# Patient Record
Sex: Female | Born: 1940 | Race: Black or African American | Hispanic: No | Smoking: Never smoker
Health system: Southern US, Community
[De-identification: ages and names within clinical notes are randomized; demographics above are authoritative.]

## PROBLEM LIST (undated history)

## (undated) DIAGNOSIS — M1990 Unspecified osteoarthritis, unspecified site: Secondary | ICD-10-CM

## (undated) DIAGNOSIS — M109 Gout, unspecified: Secondary | ICD-10-CM

## (undated) DIAGNOSIS — I10 Essential (primary) hypertension: Secondary | ICD-10-CM

## (undated) DIAGNOSIS — R51 Headache: Secondary | ICD-10-CM

## (undated) DIAGNOSIS — E119 Type 2 diabetes mellitus without complications: Secondary | ICD-10-CM

## (undated) DIAGNOSIS — J8410 Pulmonary fibrosis, unspecified: Secondary | ICD-10-CM

## (undated) HISTORY — PX: JOINT REPLACEMENT: SHX530

## (undated) HISTORY — PX: BACK SURGERY: SHX140

---

## 2002-12-18 ENCOUNTER — Encounter: Payer: Self-pay | Admitting: Neurosurgery

## 2002-12-21 ENCOUNTER — Encounter: Payer: Self-pay | Admitting: Neurosurgery

## 2002-12-21 ENCOUNTER — Inpatient Hospital Stay (HOSPITAL_COMMUNITY): Admission: RE | Admit: 2002-12-21 | Discharge: 2002-12-22 | Payer: Self-pay | Admitting: Neurosurgery

## 2003-02-13 ENCOUNTER — Encounter: Payer: Self-pay | Admitting: Neurosurgery

## 2003-02-13 ENCOUNTER — Encounter: Admission: RE | Admit: 2003-02-13 | Discharge: 2003-02-13 | Payer: Self-pay | Admitting: Neurosurgery

## 2003-03-21 ENCOUNTER — Encounter: Payer: Self-pay | Admitting: Neurosurgery

## 2003-03-21 ENCOUNTER — Encounter: Admission: RE | Admit: 2003-03-21 | Discharge: 2003-03-21 | Payer: Self-pay | Admitting: Neurosurgery

## 2007-09-23 ENCOUNTER — Inpatient Hospital Stay (HOSPITAL_COMMUNITY): Admission: RE | Admit: 2007-09-23 | Discharge: 2007-09-27 | Payer: Self-pay | Admitting: Orthopedic Surgery

## 2010-12-09 NOTE — Op Note (Signed)
[name], DACEY            ACCOUNT NO.:  [id number]   MEDICAL RECORD NO.:  [id number]          PATIENT TYPE:  INP   LOCATION:  5008                         FACILITY:  [facility]   PHYSICIAN:  Harvie Junior, M.D.   DATE OF BIRTH:  18-Oct-1940   DATE OF PROCEDURE:  09/23/2007  DATE OF DISCHARGE:                               OPERATIVE REPORT   PREOPERATIVE DIAGNOSIS:  End-stage degenerative joint disease, right  knee.   POSTOPERATIVE DIAGNOSIS:  End-stage degenerative joint disease, right  knee.   PRINCIPAL PROCEDURES:  1. Right total knee replacement with [employer] size 3 femur, size      2.5 tibia, 10-mm bridging bearing, 38-mm all-polyethylene patella.  2. Computer-assisted right total knee replacement.   SURGEON:  Harvie Junior, M.D.   ASSISTANT:  Marshia Ly, P.A.   ANESTHESIA:  General.   BRIEF HISTORY:  [name] is a 70 year old female with a long  history of having had significant right knee pain.  We treated her  conservatively for a long period of time.  Because of continued  complaints of pain, she was ultimately taken to the operating room after  failure of all conservative care for a right total knee replacement.  Preoperative x-ray showed severe end-stage degenerative joint disease of  the knee.  Because of her youthful appearance and age, it was felt that  computer-assisted alignment was critical to the approach to the case,  and this was chosen to be used preoperatively.   PROCEDURE:  The patient was taken to the operating room and after an  adequate level of anesthesia was obtained with a general anesthetic, the  patient was placed supine on the operating table, where the right leg  was prepped and draped in the usual sterile fashion.  Following this the  leg was exsanguinated and a blood pressure tourniquet inflated to 350  mmHg.  Following this a midline incision made, subcutaneous tissue taken  down to the level of the extensor mechanism,  and a medial parapatellar  arthrotomy was undertaken.  Once this was completed, the medial and  lateral meniscus were removed as well as the anterior and posterior  cruciates and the retropatellar fat pad.  Once this was done attention  was turned toward the computer assistance and two pins were placed in  the tibia, two pins in the femur, and the computer arrays were aligned,  and following this the registration process was undertaken.  This adds  about 20 minutes to the surgical procedure.  Once the long alignment was  obtained, the tibia was cut perpendicular to its long axis and the femur  was cut perpendicular to the anatomic axis and the gaps were then  measured with a spacer block.  Once this was completed, attention was  turned toward the sizing of the femur, where the femur was sized to a 3  and the anterior-posterior cuts were made as well as the chamfers and  the box cut was made.  At this point the spacer blocks had been used  with the cutting block to make sure that the flexion gap was going  to be  sufficient, and it felt to be perfectly sufficient.  Once this was  completed, the attention was turned toward the trial components.  A  trial 2.5 tibia was put in place, a trial 3 femur, a 10-mm bridging  bearing.  Attention was then turned to the patella.  A 38-mm paddle was  used and had excellent coverage, and it was medialized to flip to  lateralize the patella at insertion.  Once this was completed, the trial  poly patella was put in place and then the computer was used to assess  the long alignment.  A perfect neutral long alignment was achieved and  got balance within 5 mm at 90 degrees of flexion, perfect gap balance.  Following this, attention was turned toward removal of the trial  components.  The knee was then copiously and thoroughly lavaged with the  pulsatile lavage irrigator and suctioned dry and the final components  were cemented into place, size 3 femur, size 2.5  tibia, a 38-mm all-poly  patella and a 10-mm trial was put in place with a peg and the knee held  in about 10 degrees of flexion and then with pressure to help the cement  mantle.  Once that was completed, the attention was turned toward  allowing the cement to dry and all excess cement was removed.  The  tourniquet at this point was let down once the cement was completely  hardened and the bleeding was controlled with electrocautery.  At this  point the knee was then copiously irrigated and suctioned dry.  The  final poly was put in place.  The knee was put through a range of motion  and excellent range of motion achieved medial.  A Hemovac was placed.  The medial parapatellar arthrotomy was closed with a #1 Vicryl running.  The skin was then closed with 0 and 2-0 Vicryl and skin staples.  A  sterile compressive dressing was applied as well as a knee immobilizer.  The patient was taken to the recovery room, where she was noted to be in  satisfactory condition.  Estimated blood loss for the procedure was  none.      Harvie Junior, M.D.  Electronically Signed     JLG/MEDQ  D:  09/23/2007  T:  09/24/2007  Job:  [id number]

## 2010-12-12 NOTE — Op Note (Signed)
[name], [name]                        ACCOUNT NO.:  [id number]   MEDICAL RECORD NO.:  [id number]                   PATIENT TYPE:  INP   LOCATION:  2899                                 FACILITY:  [facility]   PHYSICIAN:  [name], M.D.              DATE OF BIRTH:  12-18-1940   DATE OF PROCEDURE:  12/21/2002  DATE OF DISCHARGE:                                 OPERATIVE REPORT   PREOPERATIVE DIAGNOSIS:  Spinal stenosis with spondylolisthesis, L4-5.   POSTOPERATIVE DIAGNOSIS:  Spinal stenosis with spondylolisthesis, L4-5.   OPERATION PERFORMED:  L4-5 decompressive laminectomy with posterior lumbar  interbody fusion with bone spacers and autologous bone graft followed by  Nuvasive transfacet screws.   SECONDARY PROCEDURE:  Microdissection, L4-5 disk and L5 nerve root  bilaterally and intraoperative EMG monitoring.   SURGEON:  [name], M.D.   ASSISTANT:  [name], M.D.   ANESTHESIA:  General.   DESCRIPTION OF PROCEDURE:  After being placed in prone position, the  patient's back was prepped and draped in the usual sterile fashion.  Localizing x-ray was taken prior to the incision to identify the appropriate  level.  A midline incision was made above the spinous processes of L4 and  L5.  Using Bovie cutting current, the incision was carried down to the  spinous processes.  Subperiosteal dissection was then carried out  bilaterally along the spinous processes, lamina and facet joints.  Self-  retaining retractor was placed for exposure.  X-ray showed approach to the  appropriate level.  Using Stille rongeur, the spinous process and  interspinous ligament were removed at L4-5.  Starting on the patient's left  side, laminotomy was performed by removing the inferior two thirds of the L4  lamina and the medial one third of the facet joint and the superior one  third of the L5 lamina.  Residual bone was removed and saved for use later  in the case and ligamentum  flavum was removed in a piecemeal fashion.  Similar decompression was carried out on the patient's right side once again  removing inferior two thirds of the L4 lamina, medial one third of the facet  joint, superior one third of the L5 lamina.  Once again residual bone was  removed and saved.  Ligamentum flavum removed.  The midline structure was  then removed to complete the bilaterally decompressive laminectomy.  At this  point microdissection was used bilaterally to identify the L4-5 disk.  It  was coagulated and sized and cleaned out vigorously with pituitary rongeurs  and curets.  At this point inspection was carried out in all directions for  any evidence of residual compression and none could be identified.  A  posterior lumbar interbody fusion was then performed.  10 mm distraction was  placed on the patient's right side.  Interbody fusion was then performed on  the left.  Scraping was done followed by chiseling  with a 10 x 9 mm chisel  under fluoroscopic guidance into good position.  The chisel was then removed  and a 10 x 9 x 20 mm Nuvasive bone bank plug was placed without difficulty.  Fluoroscopy showed the plug to be in good position.  Distracters were  removed from the right side and a similar procedure was carried out on this  side.  Once again, fluoroscopy was used to confirm good placement of all  instrumentation.  Prior to placing a second bony spacer, autologous bone  graft was placed in the midline to help with the interbody fusion.  A second  bone spacer was then placed without difficulty and fluoroscopy showed them  to be in good position.  Transfacet screws were then placed bilaterally.  Starting on the patient's right side under fluoroscopic guidance, drill over  guidewire was passed through the inferior facet of L4 into the pedicle of  L5.  Good position was confirmed by AP and lateral fluoroscopy, direct  palpation of the pedicle and intraoperative __________  monitoring.  All  these showed no evidence of a breach of the pedicle.  35 mm screw was then  placed into good position without difficulty.  A similar procedure was then  carried out on the patient's left side, once again passing the drill over  the guidewire into good position which was confirmed once again by AP and  lateral fluoroscopy.  Once again there was no evidence of breach by either  fluoroscopy, direct palpation of the pedicle, intraoperative monitoring  electrically.  Once again, 35 mm screw was placed in excellent position.  Final fluoroscopy showed the screws and plates to be in good position.  Large amounts of irrigation were carried out at this time and any bleeding  was controlled with bipolar coagulation and Gelfoam.  The wound was then  closed using interrupted Vicryl on the muscle and fascia, subcutaneous and  subcuticular tissues and staples on the skin.  Sterile dressing was then  applied.  The patient was extubated and taken to recovery room in stable  condition.                                                [name], M.D.    ROK/MEDQ  D:  12/21/2002  T:  12/21/2002  Job:  [id number]

## 2010-12-12 NOTE — Discharge Summary (Signed)
NAMEDANAIJA, [name]            ACCOUNT NO.:  [id number]   MEDICAL RECORD NO.:  [id number]          PATIENT TYPE:  INP   LOCATION:  5008                         FACILITY:  [facility]   PHYSICIAN:  Harvie Junior, M.D.   DATE OF BIRTH:  [birth year]   DATE OF ADMISSION:  09/23/2007  DATE OF DISCHARGE:  09/27/2007                               DISCHARGE SUMMARY   ADMITTING DIAGNOSES:  1. End-stage degenerative joint disease, right knee.  2. Hypertension.  3. Type 2 diabetes mellitus.  4. Status post lumbar disk surgery 2004.   DISCHARGE DIAGNOSES:  1. End-stage degenerative joint disease, right knee.  2. Hypertension.  3. Type 2 diabetes mellitus.  4. Status post lumbar disk surgery 2004.   PROCEDURES IN THE HOSPITAL:  Right total knee arthroplasty computer  assisted.   PRIMARY PHYSICIAN:  Harvie Junior, MD   BRIEF HISTORY:  [name] is a pleasant 70 year old female from  [place] with a long history of right knee pain.  X-rays of the right  knee of the weightbearing variety shows she has bone-on-bone  degenerative arthritis of the right knee.  She had persistence of night  pain and pain with ambulation and got no relief with exhaustive  conservative treatment including injection therapy, modification of  activities and use for arthritis medications.  Because of her  radiographic and clinical findings, she was felt to be a candidate for a  right total knee replacement and is admitted for this.   PERTINENT LABORATORY STUDIES:  Chest x-ray on admission showed no active  cardiopulmonary disease.  EKG showed normal sinus rhythm and normal EKG.  Hemoglobin on admission was 12.5, hematocrit 37.6.  Indices were within  normal limits.  At present, variant hemoglobin was 10.4, #2 was 9.0, #3  was 9.9.  Protime on admission was 15 and an INR of 1.2 and a PTT of 27.  On the day of discharge, her protime was 32.2 seconds with an INR of 3.0  on Coumadin therapy.  Her CMET on admission  with slightly elevated  glucose of 167 and slightly elevated BUN of 29.  On postop day #1, her  BMET was normal, had an elevated glucose of 162.  On postop day #2, her  BUN was also normal with glucose of 159.  Urinalysis on admission showed  moderate leukocyte esterase with 11-20 WBCs and few bacteria.   HOSPITAL COURSE:  The patient showed was brought to the operating room  on September 23, 2007.  Preoperatively, she was given 1 gram of Ancef and  80 mg of gentamicin.  She did have some few WBCs in her urine and was  felt that gentamicin given preoperatively was to sterilize her bladder  for this procedure.  She underwent right knee surgery as well as got Dr.  Luiz Blare' operative note.  She postoperatively was given Ancef 1 gram IV  q.8 h. x3 doses and was put on a PCA morphine pump and IV fluids.  On  postop day #1, she is without complaints.  She had moderate knee pain.  Foley was intact.  She is taking fluids without  difficulty, not got her  ABG yet.  She had a temperature of 100.6.  Vital signs were stable.  Hemoglobin was 10.4.  INR and Coumadin therapy for DVT prophylaxis was  1.4.  She was continued on IV fluids, PCA morphine, and p.o. Coumadin.  She has got out of bed to chair.  Foley catheter was continued for an  additional day.  On the postop day #2, her hemoglobin was 9.0.  She had  some complaints of knee pain.  She was afebrile.  Her vital signs were  stable.  Her PCA morphine pump was discontinued, and her IV was  converted to saline lock.  Hemovac drain was pulled and her right knee  dressing was changed.  She continued to use the CPM machine for any  range of motion and physical therapy was done on a daily basis for  walker ambulation.  On postop day #3, she was making good progress, but  was not ready for discharge to home and at that point the additional  therapy caused her risk of fall.  On postop day #4, September 27, 2007, she  was overall doing fine.  She was taking  fluids and voiding without  difficulty.  She was progressing with physical therapy.  Vital signs  stable.  She is afebrile.  Her INR was 3.0.  Her saline line was  discontinued.  She is discharged home in improved condition.  She had a  regular diet and was kept her on Percocet 5 mg p.r.n. pain and  prescription for Coumadin for DVT prophylaxis x1 month.  She started  home health physical therapy 3 times a week for a walker ambulation,  weightbearing as tolerated on the right.  Follow up with Dr. Luiz Blare in  10 days.  Her incision was banded at the time of discharge.  Her calf  was soft and nontender.  She will continue her usual home medications,  were documented in the medical reconciliation form in her chart.      Marshia Ly, P.A.      Harvie Junior, M.D.  Electronically Signed    JB/MEDQ  D:  11/30/2007  T:  12/01/2007  Job:  [id number]   cc:   Konrad Felix

## 2011-04-17 LAB — BASIC METABOLIC PANEL
BUN: 13
Chloride: 104
Creatinine, Ser: 0.88
Glucose, Bld: 162 — ABNORMAL HIGH

## 2011-04-17 LAB — CBC
HCT: 30.8 — ABNORMAL LOW
MCHC: 33.3
MCHC: 33.7
MCV: 90.6
Platelets: 298
RBC: 4.13
RDW: 13.4
WBC: 11.3 — ABNORMAL HIGH
WBC: 13.2 — ABNORMAL HIGH

## 2011-04-17 LAB — COMPREHENSIVE METABOLIC PANEL
ALT: 21
AST: 26
Alkaline Phosphatase: 82
CO2: 26
Calcium: 9.8
Chloride: 103
GFR calc Af Amer: >60
GFR calc non Af Amer: 53 — ABNORMAL LOW
Potassium: 3.6
Sodium: 137

## 2011-04-17 LAB — URINE MICROSCOPIC-ADD ON

## 2011-04-17 LAB — URINALYSIS, ROUTINE W REFLEX MICROSCOPIC
Glucose, UA: NEGATIVE
Hgb urine dipstick: NEGATIVE
Ketones, ur: NEGATIVE
Protein, ur: NEGATIVE
pH: 5.5

## 2011-04-17 LAB — DIFFERENTIAL
Basophils Absolute: 0.2 — ABNORMAL HIGH
Eosinophils Absolute: 0.1
Eosinophils Relative: 1
Monocytes Absolute: 0.5

## 2011-04-17 LAB — TYPE AND SCREEN: ABO/RH(D): B POS

## 2011-04-17 LAB — ABO/RH: ABO/RH(D): B POS

## 2011-04-17 LAB — PROTIME-INR
Prothrombin Time: 15
Prothrombin Time: 17.9 — ABNORMAL HIGH

## 2011-04-20 LAB — BASIC METABOLIC PANEL
BUN: 11
CO2: 24
Calcium: 8.6
Creatinine, Ser: 0.97
GFR calc non Af Amer: 57 — ABNORMAL LOW
Glucose, Bld: 159 — ABNORMAL HIGH
Sodium: 137

## 2011-04-20 LAB — PROTIME-INR
INR: 1.4
INR: 3 — ABNORMAL HIGH
Prothrombin Time: 17.4 — ABNORMAL HIGH
Prothrombin Time: 24 — ABNORMAL HIGH
Prothrombin Time: 32.2 — ABNORMAL HIGH

## 2011-04-20 LAB — CBC
MCHC: 33.5
MCHC: 34.1
MCV: 91.6
Platelets: 247
RBC: 3.23 — ABNORMAL LOW
RDW: 13.1
RDW: 13.4

## 2012-11-08 ENCOUNTER — Other Ambulatory Visit (HOSPITAL_COMMUNITY): Payer: Self-pay | Admitting: Orthopaedic Surgery

## 2012-11-09 ENCOUNTER — Encounter (HOSPITAL_COMMUNITY): Payer: Self-pay | Admitting: Pharmacy Technician

## 2012-11-15 ENCOUNTER — Other Ambulatory Visit (HOSPITAL_COMMUNITY): Payer: Self-pay

## 2012-11-16 ENCOUNTER — Encounter (HOSPITAL_COMMUNITY): Payer: Self-pay

## 2012-11-16 ENCOUNTER — Encounter (HOSPITAL_COMMUNITY)
Admission: RE | Admit: 2012-11-16 | Discharge: 2012-11-16 | Disposition: A | Discharge status: Home / Self Care | Payer: Medicare Other | Source: Ambulatory Visit | Attending: Orthopaedic Surgery | Admitting: Orthopaedic Surgery

## 2012-11-16 ENCOUNTER — Ambulatory Visit (HOSPITAL_COMMUNITY)
Admission: RE | Admit: 2012-11-16 | Discharge: 2012-11-16 | Disposition: A | Discharge status: Home / Self Care | Payer: Medicare Other | Source: Ambulatory Visit | Attending: Orthopaedic Surgery | Admitting: Orthopaedic Surgery

## 2012-11-16 DIAGNOSIS — I359 Nonrheumatic aortic valve disorder, unspecified: Secondary | ICD-10-CM | POA: Insufficient documentation

## 2012-11-16 DIAGNOSIS — Z01812 Encounter for preprocedural laboratory examination: Secondary | ICD-10-CM | POA: Insufficient documentation

## 2012-11-16 DIAGNOSIS — Z01818 Encounter for other preprocedural examination: Secondary | ICD-10-CM | POA: Insufficient documentation

## 2012-11-16 DIAGNOSIS — M169 Osteoarthritis of hip, unspecified: Secondary | ICD-10-CM | POA: Insufficient documentation

## 2012-11-16 DIAGNOSIS — M1610 Unilateral primary osteoarthritis, unspecified hip: Secondary | ICD-10-CM | POA: Insufficient documentation

## 2012-11-16 DIAGNOSIS — I10 Essential (primary) hypertension: Secondary | ICD-10-CM | POA: Insufficient documentation

## 2012-11-16 DIAGNOSIS — Z01810 Encounter for preprocedural cardiovascular examination: Secondary | ICD-10-CM | POA: Insufficient documentation

## 2012-11-16 HISTORY — DX: Headache: R51

## 2012-11-16 HISTORY — DX: Type 2 diabetes mellitus without complications: E11.9

## 2012-11-16 HISTORY — DX: Essential (primary) hypertension: I10

## 2012-11-16 HISTORY — DX: Unspecified osteoarthritis, unspecified site: M19.90

## 2012-11-16 HISTORY — DX: Pulmonary fibrosis, unspecified: J84.10

## 2012-11-16 LAB — BASIC METABOLIC PANEL
BUN: 18 mg/dL (ref 6–23)
Creatinine, Ser: 0.96 mg/dL (ref 0.50–1.10)
GFR calc non Af Amer: 57 mL/min — ABNORMAL LOW (ref >90)
Glucose, Bld: 185 mg/dL — ABNORMAL HIGH (ref 70–99)
Potassium: 3.6 mEq/L (ref 3.5–5.1)

## 2012-11-16 LAB — URINALYSIS, ROUTINE W REFLEX MICROSCOPIC
Glucose, UA: NEGATIVE mg/dL
Nitrite: NEGATIVE
Specific Gravity, Urine: 1.027 (ref 1.005–1.030)
pH: 5.5 (ref 5.0–8.0)

## 2012-11-16 LAB — URINE MICROSCOPIC-ADD ON

## 2012-11-16 LAB — CBC
Hemoglobin: 11.7 g/dL — ABNORMAL LOW (ref 12.0–15.0)
MCH: 29.9 pg (ref 26.0–34.0)
RBC: 3.91 MIL/uL (ref 3.87–5.11)
WBC: 9.1 10*3/uL (ref 4.0–10.5)

## 2012-11-16 LAB — APTT: aPTT: 29 seconds (ref 24–37)

## 2012-11-16 LAB — SURGICAL PCR SCREEN
MRSA, PCR: NEGATIVE
Staphylococcus aureus: NEGATIVE

## 2012-11-16 NOTE — Progress Notes (Signed)
Last office visit note with Dr Donnel Saxon ( PCP) placed on chart dated 10/24/12.

## 2012-11-16 NOTE — Patient Instructions (Signed)
KARNA ABED  11/16/2012   Your procedure is scheduled on:  11/18/12   Report to [facility] at     0730  AM.  Call this number if you have problems the morning of surgery: [phone]   Remember:   Do not eat food or drink liquids after midnight.   Take these medicines the morning of surgery with A SIP OF WATER:    Do not wear jewelry, make-up or nail polish.  Do not wear lotions, powders, or perfumes.   Do not shave 48 hours prior to surgery.   Do not bring valuables to the hospital.  Contacts, dentures or bridgework may not be worn into surgery.  Leave suitcase in the car. After surgery it may be brought to your room.  For patients admitted to the hospital, checkout time is 11:00 AM the day of  discharge.       SEE CHG INSTRUCTION SHEET    Please read over the following fact sheets that you were given: MRSA Information, coughing and deep breathing exercises, leg exercises,Blood Transfusion Fact sheet                Failure to comply with these instructions may result in cancellation of your surgery.                Patient Signature ____________________________              Nurse Signature _____________________________

## 2012-11-18 ENCOUNTER — Inpatient Hospital Stay (HOSPITAL_COMMUNITY)
Admission: RE | Admit: 2012-11-18 | Discharge: 2012-11-21 | DRG: 470 | Disposition: A | Discharge status: Skilled Nursing Facility | Payer: Medicare Other | Source: Ambulatory Visit | Attending: Orthopaedic Surgery | Admitting: Orthopaedic Surgery

## 2012-11-18 ENCOUNTER — Encounter (HOSPITAL_COMMUNITY): Payer: Self-pay | Admitting: *Deleted

## 2012-11-18 ENCOUNTER — Inpatient Hospital Stay (HOSPITAL_COMMUNITY): Payer: Medicare Other

## 2012-11-18 ENCOUNTER — Inpatient Hospital Stay (HOSPITAL_COMMUNITY): Payer: Medicare Other | Admitting: Anesthesiology

## 2012-11-18 ENCOUNTER — Encounter (HOSPITAL_COMMUNITY)
Admission: RE | Disposition: A | Discharge status: Skilled Nursing Facility | Payer: Self-pay | Source: Ambulatory Visit | Attending: Orthopaedic Surgery

## 2012-11-18 ENCOUNTER — Encounter (HOSPITAL_COMMUNITY): Payer: Self-pay | Admitting: Anesthesiology

## 2012-11-18 DIAGNOSIS — K5900 Constipation, unspecified: Secondary | ICD-10-CM | POA: Diagnosis present

## 2012-11-18 DIAGNOSIS — I10 Essential (primary) hypertension: Secondary | ICD-10-CM | POA: Diagnosis present

## 2012-11-18 DIAGNOSIS — E119 Type 2 diabetes mellitus without complications: Secondary | ICD-10-CM | POA: Diagnosis present

## 2012-11-18 DIAGNOSIS — M1610 Unilateral primary osteoarthritis, unspecified hip: Principal | ICD-10-CM | POA: Diagnosis present

## 2012-11-18 DIAGNOSIS — D62 Acute posthemorrhagic anemia: Secondary | ICD-10-CM | POA: Diagnosis not present

## 2012-11-18 DIAGNOSIS — M169 Osteoarthritis of hip, unspecified: Secondary | ICD-10-CM

## 2012-11-18 DIAGNOSIS — Z96659 Presence of unspecified artificial knee joint: Secondary | ICD-10-CM

## 2012-11-18 HISTORY — PX: TOTAL HIP ARTHROPLASTY: SHX124

## 2012-11-18 LAB — TYPE AND SCREEN: ABO/RH(D): B POS

## 2012-11-18 LAB — GLUCOSE, CAPILLARY: Glucose-Capillary: 147 mg/dL — ABNORMAL HIGH (ref 70–99)

## 2012-11-18 SURGERY — ARTHROPLASTY, HIP, TOTAL, ANTERIOR APPROACH
Anesthesia: Spinal | Site: Hip | Laterality: Left | Wound class: Clean

## 2012-11-18 MED ORDER — ATORVASTATIN CALCIUM 40 MG PO TABS
40.0000 mg | ORAL_TABLET | Freq: Every day | ORAL | Status: DC
  Administered 2012-11-18 – 2012-11-20 (×3): 40 mg via ORAL
  Filled 2012-11-18 (×4): qty 1

## 2012-11-18 MED ORDER — CEFAZOLIN SODIUM 1-5 GM-% IV SOLN
1.0000 g | Freq: Four times a day (QID) | INTRAVENOUS | Status: AC
  Administered 2012-11-18 (×2): 1 g via INTRAVENOUS
  Filled 2012-11-18 (×2): qty 50

## 2012-11-18 MED ORDER — LIDOCAINE HCL (CARDIAC) 10 MG/ML IV SOLN
INTRAVENOUS | Status: DC | PRN
  Administered 2012-11-18: 50 mg via INTRAVENOUS

## 2012-11-18 MED ORDER — PROPOFOL 10 MG/ML IV EMUL
INTRAVENOUS | Status: DC | PRN
  Administered 2012-11-18: 50 ug/kg/min via INTRAVENOUS

## 2012-11-18 MED ORDER — ALUM & MAG HYDROXIDE-SIMETH 200-200-20 MG/5ML PO SUSP: 30.0000 mL | ORAL | Status: DC | PRN

## 2012-11-18 MED ORDER — ACETAMINOPHEN 325 MG PO TABS: 650.0000 mg | ORAL_TABLET | Freq: Four times a day (QID) | ORAL | Status: DC | PRN

## 2012-11-18 MED ORDER — FENTANYL CITRATE 0.05 MG/ML IJ SOLN
INTRAMUSCULAR | Status: DC | PRN
  Administered 2012-11-18: 100 ug via INTRAVENOUS

## 2012-11-18 MED ORDER — SODIUM CHLORIDE 0.9 % IV SOLN
10.0000 mg | INTRAVENOUS | Status: DC | PRN
  Administered 2012-11-18: 50 ug/min via INTRAVENOUS

## 2012-11-18 MED ORDER — ASPIRIN EC 325 MG PO TBEC
325.0000 mg | DELAYED_RELEASE_TABLET | Freq: Two times a day (BID) | ORAL | Status: DC
  Administered 2012-11-19 – 2012-11-21 (×5): 325 mg via ORAL
  Filled 2012-11-18 (×7): qty 1

## 2012-11-18 MED ORDER — SODIUM CHLORIDE 0.9 % IR SOLN
Status: DC | PRN
  Administered 2012-11-18: 1000 mL

## 2012-11-18 MED ORDER — KETOROLAC TROMETHAMINE 30 MG/ML IJ SOLN
15.0000 mg | Freq: Once | INTRAMUSCULAR | Status: AC | PRN
  Administered 2012-11-18: 30 mg via INTRAVENOUS

## 2012-11-18 MED ORDER — CEFAZOLIN SODIUM-DEXTROSE 2-3 GM-% IV SOLR
2.0000 g | INTRAVENOUS | Status: AC
  Administered 2012-11-18: 2 g via INTRAVENOUS

## 2012-11-18 MED ORDER — ONDANSETRON HCL 4 MG/2ML IJ SOLN
4.0000 mg | Freq: Four times a day (QID) | INTRAMUSCULAR | Status: DC | PRN
  Administered 2012-11-19: 4 mg via INTRAVENOUS
  Filled 2012-11-18: qty 2

## 2012-11-18 MED ORDER — ACETAMINOPHEN 650 MG RE SUPP: 650.0000 mg | Freq: Four times a day (QID) | RECTAL | Status: DC | PRN

## 2012-11-18 MED ORDER — OLMESARTAN MEDOXOMIL 40 MG PO TABS
40.0000 mg | ORAL_TABLET | Freq: Every day | ORAL | Status: DC
  Administered 2012-11-18 – 2012-11-21 (×4): 40 mg via ORAL
  Filled 2012-11-18 (×4): qty 1

## 2012-11-18 MED ORDER — NEBIVOLOL HCL 10 MG PO TABS
10.0000 mg | ORAL_TABLET | Freq: Every morning | ORAL | Status: DC
  Administered 2012-11-19 – 2012-11-21 (×3): 10 mg via ORAL
  Filled 2012-11-18 (×3): qty 1

## 2012-11-18 MED ORDER — HYDROMORPHONE HCL PF 1 MG/ML IJ SOLN: 1.0000 mg | INTRAMUSCULAR | Status: DC | PRN

## 2012-11-18 MED ORDER — ZOLPIDEM TARTRATE 5 MG PO TABS: 5.0000 mg | ORAL_TABLET | Freq: Every evening | ORAL | Status: DC | PRN

## 2012-11-18 MED ORDER — PROMETHAZINE HCL 25 MG/ML IJ SOLN: 6.2500 mg | INTRAMUSCULAR | Status: DC | PRN

## 2012-11-18 MED ORDER — VITAMIN D (ERGOCALCIFEROL) 1.25 MG (50000 UNIT) PO CAPS
50000.0000 [IU] | ORAL_CAPSULE | ORAL | Status: DC
  Administered 2012-11-21: 50000 [IU] via ORAL
  Filled 2012-11-18: qty 1

## 2012-11-18 MED ORDER — LACTATED RINGERS IV SOLN
INTRAVENOUS | Status: DC
  Administered 2012-11-18: 11:00:00 via INTRAVENOUS
  Administered 2012-11-18: 1000 mL via INTRAVENOUS

## 2012-11-18 MED ORDER — OXYCODONE HCL 5 MG PO TABS
5.0000 mg | ORAL_TABLET | ORAL | Status: DC | PRN
  Administered 2012-11-18: 10 mg via ORAL
  Administered 2012-11-19 (×3): 5 mg via ORAL
  Administered 2012-11-19: 10 mg via ORAL
  Administered 2012-11-19 – 2012-11-20 (×2): 5 mg via ORAL
  Administered 2012-11-20: 10 mg via ORAL
  Administered 2012-11-21: 5 mg via ORAL
  Filled 2012-11-18 (×4): qty 1
  Filled 2012-11-18 (×3): qty 2
  Filled 2012-11-18: qty 1
  Filled 2012-11-18: qty 2

## 2012-11-18 MED ORDER — MIDAZOLAM HCL 5 MG/5ML IJ SOLN
INTRAMUSCULAR | Status: DC | PRN
  Administered 2012-11-18: 2 mg via INTRAVENOUS

## 2012-11-18 MED ORDER — GLIMEPIRIDE 4 MG PO TABS
4.0000 mg | ORAL_TABLET | Freq: Every day | ORAL | Status: DC
  Administered 2012-11-18 – 2012-11-21 (×4): 4 mg via ORAL
  Filled 2012-11-18 (×5): qty 1

## 2012-11-18 MED ORDER — DIPHENHYDRAMINE HCL 12.5 MG/5ML PO ELIX: 12.5000 mg | ORAL_SOLUTION | ORAL | Status: DC | PRN

## 2012-11-18 MED ORDER — METHOCARBAMOL 100 MG/ML IJ SOLN: 500.0000 mg | Freq: Four times a day (QID) | INTRAVENOUS | Status: DC | PRN

## 2012-11-18 MED ORDER — ALISKIREN FUMARATE 150 MG PO TABS
150.0000 mg | ORAL_TABLET | Freq: Every day | ORAL | Status: DC
  Administered 2012-11-19 – 2012-11-21 (×3): 150 mg via ORAL
  Filled 2012-11-18 (×3): qty 1

## 2012-11-18 MED ORDER — OXYCODONE HCL ER 10 MG PO T12A
10.0000 mg | EXTENDED_RELEASE_TABLET | Freq: Two times a day (BID) | ORAL | Status: DC
  Administered 2012-11-18 – 2012-11-21 (×6): 10 mg via ORAL
  Filled 2012-11-18 (×5): qty 1

## 2012-11-18 MED ORDER — HYDRALAZINE HCL 50 MG PO TABS
50.0000 mg | ORAL_TABLET | Freq: Every day | ORAL | Status: DC
  Administered 2012-11-19 – 2012-11-21 (×3): 50 mg via ORAL
  Filled 2012-11-18 (×3): qty 1

## 2012-11-18 MED ORDER — PHENOL 1.4 % MT LIQD: 1.0000 | OROMUCOSAL | Status: DC | PRN

## 2012-11-18 MED ORDER — MENTHOL 3 MG MT LOZG: 1.0000 | LOZENGE | OROMUCOSAL | Status: DC | PRN

## 2012-11-18 MED ORDER — STERILE WATER FOR IRRIGATION IR SOLN
Status: DC | PRN
  Administered 2012-11-18: 3000 mL

## 2012-11-18 MED ORDER — HYDROCHLOROTHIAZIDE 25 MG PO TABS
25.0000 mg | ORAL_TABLET | Freq: Every day | ORAL | Status: DC
  Administered 2012-11-18 – 2012-11-21 (×4): 25 mg via ORAL
  Filled 2012-11-18 (×5): qty 1

## 2012-11-18 MED ORDER — BUPIVACAINE HCL (PF) 0.5 % IJ SOLN
INTRAMUSCULAR | Status: DC | PRN
  Administered 2012-11-18: 15 mg

## 2012-11-18 MED ORDER — METHOCARBAMOL 500 MG PO TABS
500.0000 mg | ORAL_TABLET | Freq: Four times a day (QID) | ORAL | Status: DC | PRN
  Administered 2012-11-18 – 2012-11-20 (×4): 500 mg via ORAL
  Filled 2012-11-18 (×4): qty 1

## 2012-11-18 MED ORDER — 0.9 % SODIUM CHLORIDE (POUR BTL) OPTIME
TOPICAL | Status: DC | PRN
  Administered 2012-11-18: 1000 mL

## 2012-11-18 MED ORDER — FENTANYL CITRATE 0.05 MG/ML IJ SOLN
25.0000 ug | INTRAMUSCULAR | Status: DC | PRN
  Administered 2012-11-18: 50 ug via INTRAVENOUS

## 2012-11-18 MED ORDER — DOCUSATE SODIUM 100 MG PO CAPS
100.0000 mg | ORAL_CAPSULE | Freq: Two times a day (BID) | ORAL | Status: DC
  Administered 2012-11-18 – 2012-11-21 (×6): 100 mg via ORAL

## 2012-11-18 MED ORDER — LINAGLIPTIN 5 MG PO TABS
5.0000 mg | ORAL_TABLET | Freq: Every day | ORAL | Status: DC
  Administered 2012-11-18 – 2012-11-21 (×4): 5 mg via ORAL
  Filled 2012-11-18 (×4): qty 1

## 2012-11-18 MED ORDER — OLMESARTAN MEDOXOMIL-HCTZ 40-25 MG PO TABS
1.0000 | ORAL_TABLET | Freq: Every morning | ORAL | Status: DC
Start: 2012-11-18 — End: 2012-11-18

## 2012-11-18 MED ORDER — SODIUM CHLORIDE 0.9 % IV SOLN
INTRAVENOUS | Status: DC
  Administered 2012-11-18: 75 mL/h via INTRAVENOUS
  Administered 2012-11-19 – 2012-11-20 (×2): via INTRAVENOUS

## 2012-11-18 MED ORDER — METOCLOPRAMIDE HCL 5 MG/ML IJ SOLN: 5.0000 mg | Freq: Three times a day (TID) | INTRAMUSCULAR | Status: DC | PRN

## 2012-11-18 MED ORDER — METOCLOPRAMIDE HCL 5 MG PO TABS
5.0000 mg | ORAL_TABLET | Freq: Three times a day (TID) | ORAL | Status: DC | PRN
  Filled 2012-11-18: qty 2

## 2012-11-18 MED ORDER — PROPOFOL 10 MG/ML IV BOLUS
INTRAVENOUS | Status: DC | PRN
  Administered 2012-11-18: 20 mg via INTRAVENOUS

## 2012-11-18 MED ORDER — ONDANSETRON HCL 4 MG PO TABS: 4.0000 mg | ORAL_TABLET | Freq: Four times a day (QID) | ORAL | Status: DC | PRN

## 2012-11-18 SURGICAL SUPPLY — 35 items
BAG ZIPLOCK 12X15 (MISCELLANEOUS) ×4 IMPLANT
BLADE SAW SGTL 18X1.27X75 (BLADE) ×2 IMPLANT
CELLS DAT CNTRL 66122 CELL SVR (MISCELLANEOUS) ×1 IMPLANT
CLOTH BEACON ORANGE TIMEOUT ST (SAFETY) ×2 IMPLANT
DERMABOND ADVANCED (GAUZE/BANDAGES/DRESSINGS) ×1
DERMABOND ADVANCED .7 DNX12 (GAUZE/BANDAGES/DRESSINGS) ×1 IMPLANT
DRAPE C-ARM 42X72 X-RAY (DRAPES) ×2 IMPLANT
DRAPE STERI IOBAN 125X83 (DRAPES) ×2 IMPLANT
DRAPE U-SHAPE 47X51 STRL (DRAPES) ×6 IMPLANT
DRSG AQUACEL AG ADV 3.5X10 (GAUZE/BANDAGES/DRESSINGS) ×2 IMPLANT
DURAPREP 26ML APPLICATOR (WOUND CARE) ×2 IMPLANT
ELECT BLADE TIP CTD 4 INCH (ELECTRODE) ×2 IMPLANT
ELECT REM PT RETURN 9FT ADLT (ELECTROSURGICAL) ×2
ELECTRODE REM PT RTRN 9FT ADLT (ELECTROSURGICAL) ×1 IMPLANT
FACESHIELD LNG OPTICON STERILE (SAFETY) ×10 IMPLANT
GLOVE BIO SURGEON STRL SZ7.5 (GLOVE) ×2 IMPLANT
GLOVE BIOGEL PI IND STRL 8 (GLOVE) ×3 IMPLANT
GLOVE BIOGEL PI INDICATOR 8 (GLOVE) ×3
GLOVE ECLIPSE 8.0 STRL XLNG CF (GLOVE) ×4 IMPLANT
GOWN STRL REIN XL XLG (GOWN DISPOSABLE) ×4 IMPLANT
HANDPIECE INTERPULSE COAX TIP (DISPOSABLE) ×1
KIT BASIN OR (CUSTOM PROCEDURE TRAY) ×2 IMPLANT
PACK TOTAL JOINT (CUSTOM PROCEDURE TRAY) ×2 IMPLANT
PADDING CAST COTTON 6X4 STRL (CAST SUPPLIES) ×2 IMPLANT
RTRCTR WOUND ALEXIS 18CM MED (MISCELLANEOUS) ×2
SET HNDPC FAN SPRY TIP SCT (DISPOSABLE) ×1 IMPLANT
SUT ETHIBOND NAB CT1 #1 30IN (SUTURE) ×4 IMPLANT
SUT MNCRL AB 4-0 PS2 18 (SUTURE) ×2 IMPLANT
SUT NYLON 3 0 (SUTURE) ×2 IMPLANT
SUT VIC AB 1 CT1 36 (SUTURE) ×4 IMPLANT
SUT VIC AB 2-0 CT1 27 (SUTURE) ×2
SUT VIC AB 2-0 CT1 TAPERPNT 27 (SUTURE) ×2 IMPLANT
TOWEL OR 17X26 10 PK STRL BLUE (TOWEL DISPOSABLE) ×4 IMPLANT
TOWEL OR NON WOVEN STRL DISP B (DISPOSABLE) ×2 IMPLANT
TRAY FOLEY CATH 14FRSI W/METER (CATHETERS) ×2 IMPLANT

## 2012-11-18 NOTE — H&P (Signed)
TOTAL HIP ADMISSION H&P  Patient is admitted for left total hip arthroplasty.  Subjective:  Chief Complaint: left hip pain  HPI: [name], 72 y.o. female, has a history of pain and functional disability in the left hip(s) due to arthritis and patient has failed non-surgical conservative treatments for greater than 12 weeks to include NSAID's and/or analgesics, corticosteriod injections, use of assistive devices, weight reduction as appropriate and activity modification.  Onset of symptoms was gradual starting 4 years ago with gradually worsening course since that time.The patient noted no past surgery on the left hip(s).  Patient currently rates pain in the left hip at 9 out of 10 with activity. Patient has night pain, worsening of pain with activity and weight bearing, trendelenberg gait, pain that interfers with activities of daily living and pain with passive range of motion. Patient has evidence of subchondral cysts, subchondral sclerosis, periarticular osteophytes and joint space narrowing by imaging studies. This condition presents safety issues increasing the risk of falls.  There is no current active infection.  Patient Active Problem List   Diagnosis Date Noted  . Degenerative arthritis of hip 11/18/2012   Past Medical History  Diagnosis Date  . Hypertension   . Lung fibrosis   . Diabetes mellitus without complication   . Headache     occasional headache   . Arthritis     Past Surgical History  Procedure Laterality Date  . Back surgery      lumbar fusion   . Joint replacement      right knee replacement     Prescriptions prior to admission  Medication Sig Dispense Refill  . aliskiren (TEKTURNA) 150 MG tablet Take 150 mg by mouth daily.      Marland Kitchen aspirin 325 MG tablet Take 325 mg by mouth daily.      . Aspirin-Acetaminophen-Caffeine (GOODYS EXTRA STRENGTH) [phone] MG PACK Take 1 packet by mouth every morning.      Marland Kitchen atorvastatin (LIPITOR) 40 MG tablet Take 40 mg  by mouth every morning.      . bisacodyl (DULCOLAX) 5 MG EC tablet Take 5 mg by mouth daily as needed for constipation.      Marland Kitchen glimepiride (AMARYL) 4 MG tablet Take 4 mg by mouth daily before breakfast.      . guaiFENesin-codeine (ROBITUSSIN AC) 100-10 MG/5ML syrup Take 5 mLs by mouth 3 (three) times daily as needed for cough.      . hydrALAZINE (APRESOLINE) 25 MG tablet Take 50 mg by mouth daily.       . nebivolol (BYSTOLIC) 10 MG tablet Take 10 mg by mouth every morning.      . olmesartan-hydrochlorothiazide (BENICAR HCT) 40-25 MG per tablet Take 1 tablet by mouth every morning.      Marland Kitchen oxyCODONE-acetaminophen (PERCOCET/ROXICET) 5-325 MG per tablet Take 1 tablet by mouth every 6 (six) hours as needed for pain.      . saxagliptin HCl (ONGLYZA) 5 MG TABS tablet Take 5 mg by mouth every morning.      . Vitamin D, Ergocalciferol, (DRISDOL) 50000 UNITS CAPS Take 50,000 Units by mouth 2 (two) times a week. On Monday and Thursdays       Allergies  Allergen Reactions  . Penicillins Hives and Rash    History  Substance Use Topics  . Smoking status: Never Smoker   . Smokeless tobacco: Never Used  . Alcohol Use: No    History reviewed. No pertinent family history.   Review of Systems  Musculoskeletal: Positive for joint pain.  All other systems reviewed and are negative.    Objective:  Physical Exam  Constitutional: She is oriented to person, place, and time. She appears well-developed and well-nourished.  HENT:  Head: Normocephalic and atraumatic.  Eyes: EOM are normal. Pupils are equal, round, and reactive to light.  Neck: Normal range of motion. Neck supple.  Cardiovascular: Normal rate and regular rhythm.   Respiratory: Effort normal and breath sounds normal.  GI: Soft. Bowel sounds are normal.  Musculoskeletal:       Left hip: She exhibits decreased range of motion, decreased strength, bony tenderness and crepitus.  Neurological: She is alert and oriented to person, place, and  time.  Skin: Skin is warm and dry.  Psychiatric: She has a normal mood and affect.    Vital signs in last 24 hours: Temp:  [98.6 F (37 C)] 98.6 F (37 C) (04/25 0708) Pulse Rate:  [81] 81 (04/25 0708) Resp:  [18] 18 (04/25 0708) BP: (179)/(71) 179/71 mmHg (04/25 0708) SpO2:  [99 %] 99 % (04/25 0708)  Labs:   There is no weight on file to calculate BMI.   Imaging Review Plain radiographs demonstrate severe degenerative joint disease of the left hip(s). The bone quality appears to be good for age and reported activity level.  Assessment/Plan:  End stage arthritis, left hip(s)  The patient history, physical examination, clinical judgement of the provider and imaging studies are consistent with end stage degenerative joint disease of the left hip(s) and total hip arthroplasty is deemed medically necessary. The treatment options including medical management, injection therapy, arthroscopy and arthroplasty were discussed at length. The risks and benefits of total hip arthroplasty were presented and reviewed. The risks due to aseptic loosening, infection, stiffness, dislocation/subluxation,  thromboembolic complications and other imponderables were discussed.  The patient acknowledged the explanation, agreed to proceed with the plan and consent was signed. Patient is being admitted for inpatient treatment for surgery, pain control, PT, OT, prophylactic antibiotics, VTE prophylaxis, progressive ambulation and ADL's and discharge planning.The patient is planning to be discharged home with home health services

## 2012-11-18 NOTE — Brief Op Note (Signed)
11/18/2012  12:14 PM  PATIENT:  [name]  72 y.o. female  PRE-OPERATIVE DIAGNOSIS:  Left hip osteoarthritis  POST-OPERATIVE DIAGNOSIS:  Left hip osteoarthritis  PROCEDURE:  Procedure(s): LEFT TOTAL HIP ARTHROPLASTY ANTERIOR APPROACH (Left)  SURGEON:  Surgeon(s) and Role:    * Kathryne Hitch, MD - Primary  PHYSICIAN ASSISTANT:   Rexene Edison, PA-C  ANESTHESIA:   spinal  EBL:  Total I/O In: 1000 [I.V.:1000] Out: 325 [Blood:325]  BLOOD ADMINISTERED:none  DRAINS: none   LOCAL MEDICATIONS USED:  NONE  SPECIMEN:  No Specimen  DISPOSITION OF SPECIMEN:  N/A  COUNTS:  YES  TOURNIQUET:  * No tourniquets in log *  DICTATION: .Other Dictation: Dictation Number [id number]  PLAN OF CARE: Admit to inpatient   PATIENT DISPOSITION:  PACU - hemodynamically stable.   Delay start of Pharmacological VTE agent (>24hrs) due to surgical blood loss or risk of bleeding: no

## 2012-11-18 NOTE — Anesthesia Procedure Notes (Signed)
Spinal Patient location during procedure: OR Staffing Performed by: anesthesiologist  Preanesthetic Checklist Completed: patient identified, site marked, surgical consent, pre-op evaluation, timeout performed, IV checked, risks and benefits discussed and monitors and equipment checked Spinal Block Patient position: sitting Prep: Betadine Patient monitoring: heart rate, continuous pulse ox and blood pressure Injection technique: single-shot Needle Needle type: Sprotte  Needle gauge: 22 G Needle length: 9 cm Additional Notes Expiration date of kit checked and confirmed. Patient tolerated procedure well, without complications.     

## 2012-11-18 NOTE — Anesthesia Preprocedure Evaluation (Signed)
Anesthesia Evaluation  Patient identified by MRN, date of birth, ID band Patient awake    Reviewed: Allergy & Precautions, H&P , NPO status , Patient's Chart, lab work & pertinent test results  Airway Mallampati: II TM Distance: <3 FB Neck ROM: Full    Dental no notable dental hx.    Pulmonary neg pulmonary ROS,  breath sounds clear to auscultation  Pulmonary exam normal       Cardiovascular hypertension, Pt. on medications Rhythm:Regular Rate:Normal     Neuro/Psych negative neurological ROS  negative psych ROS   GI/Hepatic negative GI ROS, Neg liver ROS,   Endo/Other  diabetes, Type 2  Renal/GU negative Renal ROS  negative genitourinary   Musculoskeletal negative musculoskeletal ROS (+)   Abdominal   Peds negative pediatric ROS (+)  Hematology negative hematology ROS (+)   Anesthesia Other Findings   Reproductive/Obstetrics negative OB ROS                           Anesthesia Physical Anesthesia Plan  ASA: II  Anesthesia Plan: Spinal   Post-op Pain Management:    Induction:   Airway Management Planned: Simple Face Mask  Additional Equipment:   Intra-op Plan:   Post-operative Plan:   Informed Consent: I have reviewed the patients History and Physical, chart, labs and discussed the procedure including the risks, benefits and alternatives for the proposed anesthesia with the patient or authorized representative who has indicated his/her understanding and acceptance.     Plan Discussed with: CRNA and Surgeon  Anesthesia Plan Comments:         Anesthesia Quick Evaluation

## 2012-11-18 NOTE — Transfer of Care (Signed)
Immediate Anesthesia Transfer of Care Note  Patient: [name]  Procedure(s) Performed: Procedure(s): LEFT TOTAL HIP ARTHROPLASTY ANTERIOR APPROACH (Left)  Patient Location: PACU  Anesthesia Type:Spinal  Level of Consciousness: awake, alert , oriented and patient cooperative  Airway & Oxygen Therapy: Patient Spontanous Breathing and Patient connected to face mask oxygen  Post-op Assessment: Report given to PACU RN, Post -op Vital signs reviewed and stable and Patient moving all extremities X 4  Post vital signs: stable  Complications: No apparent anesthesia complications  L4 spinal level moving feet unable to bend knees

## 2012-11-18 NOTE — Progress Notes (Signed)
Utilization review completed.  

## 2012-11-18 NOTE — Progress Notes (Signed)
Patient ID: [name], female   DOB: 01-29-1941, 72 y.o.   MRN: [id number] I have reviewed [name]'s x-rays from her direct anterior hip surgery.  She does not have a greater trochanteric fracture, but just a small cortical irregularity near the tip consistent with the surgery.  She can still put full weight on her left hip as tolerated.

## 2012-11-18 NOTE — Progress Notes (Signed)
PT NOTE:  PT attempted POD 0  but deferred at pt request.  Will follow in am.

## 2012-11-18 NOTE — Anesthesia Postprocedure Evaluation (Signed)
  Anesthesia Post-op Note  Patient: [name]  Procedure(s) Performed: Procedure(s) (LRB): LEFT TOTAL HIP ARTHROPLASTY ANTERIOR APPROACH (Left)  Patient Location: PACU  Anesthesia Type: Spinal  Level of Consciousness: awake and alert   Airway and Oxygen Therapy: Patient Spontanous Breathing  Post-op Pain: mild  Post-op Assessment: Post-op Vital signs reviewed, Patient's Cardiovascular Status Stable, Respiratory Function Stable, Patent Airway and No signs of Nausea or vomiting  Last Vitals:  Filed Vitals:   11/18/12 1315  BP: 178/71  Pulse: 74  Temp: 36.7 C  Resp: 12    Post-op Vital Signs: stable   Complications: No apparent anesthesia complications

## 2012-11-19 LAB — BASIC METABOLIC PANEL
BUN: 16 mg/dL (ref 6–23)
Calcium: 8.8 mg/dL (ref 8.4–10.5)
Creatinine, Ser: 0.99 mg/dL (ref 0.50–1.10)
GFR calc Af Amer: 64 mL/min — ABNORMAL LOW (ref >90)
GFR calc non Af Amer: 55 mL/min — ABNORMAL LOW (ref >90)

## 2012-11-19 LAB — CBC
HCT: 28.1 % — ABNORMAL LOW (ref 36.0–46.0)
MCH: 30.2 pg (ref 26.0–34.0)
MCHC: 33.5 g/dL (ref 30.0–36.0)
MCV: 90.4 fL (ref 78.0–100.0)
RDW: 13.9 % (ref 11.5–15.5)

## 2012-11-19 LAB — GLUCOSE, CAPILLARY: Glucose-Capillary: 196 mg/dL — ABNORMAL HIGH (ref 70–99)

## 2012-11-19 MED ORDER — MAGNESIUM HYDROXIDE 400 MG/5ML PO SUSP
15.0000 mL | Freq: Every day | ORAL | Status: DC | PRN
  Administered 2012-11-20: 15 mL via ORAL
  Filled 2012-11-19: qty 30

## 2012-11-19 MED ORDER — FLEET ENEMA 7-19 GM/118ML RE ENEM
1.0000 | ENEMA | Freq: Every day | RECTAL | Status: DC | PRN
  Administered 2012-11-20: 1 via RECTAL
  Filled 2012-11-19: qty 1

## 2012-11-19 NOTE — Evaluation (Signed)
Occupational Therapy Evaluation Patient Details Name: [name] MRN: [id number] DOB: 1941-05-30 Today's Date: 11/19/2012 Time: 0102-7253 OT Time Calculation (min): 22 min  OT Assessment / Plan / Recommendation Clinical Impression  Pt is s/p L direct anterior THA and displays decreased strength, some pain with activity and will benefit from skilled OT services to improve ADL independence.     OT Assessment  Patient needs continued OT Services    Follow Up Recommendations  Home health OT versus SNF;Depends on how much family assist is available and pt progress. Supervision/Assistance - 24 hour    Barriers to Discharge      Equipment Recommendations  None recommended by OT    Recommendations for Other Services    Frequency  Min 2X/week    Precautions / Restrictions Precautions Precautions: None Precaution Comments: direct anterior THA Restrictions Weight Bearing Restrictions: No Other Position/Activity Restrictions: WBAT        ADL  Eating/Feeding: Simulated;Independent Where Assessed - Eating/Feeding: Bed level Grooming: Simulated;Wash/dry hands;Set up Where Assessed - Grooming: Supported sitting Upper Body Bathing: Simulated;Chest;Right arm;Left arm;Abdomen;Set up Where Assessed - Upper Body Bathing: Unsupported sitting Lower Body Bathing: Simulated;+2 Total assistance Lower Body Bathing: Patient Percentage: 60% Where Assessed - Lower Body Bathing: Supported sit to stand Upper Body Dressing: Simulated;Set up Where Assessed - Upper Body Dressing: Unsupported sitting Lower Body Dressing: Simulated;+2 Total assistance Lower Body Dressing: Patient Percentage: 50% Where Assessed - Lower Body Dressing: Supported sit to stand Toilet Transfer: Simulated;+2 Total assistance Toilet Transfer: Patient Percentage: 70% Toilet Transfer Method: Sit to stand;Other (comment) (up to walk in hallway with PT, chair pulled up) Toileting - Clothing Manipulation and Hygiene:  Simulated;+2 Total assistance Toileting - Clothing Manipulation and Hygiene: Patient Percentage: 60% Where Assessed - Toileting Clothing Manipulation and Hygiene: Standing Equipment Used: Rolling walker ADL Comments: Pt very motivated. She states she lives alone and not sure that family can provide initial 24/7 assist. Will need to confirm how much help she will have. HH versus SNF. She is interested in AE for LB ADL    OT Diagnosis: Generalized weakness  OT Problem List: Decreased strength OT Treatment Interventions: Self-care/ADL training;DME and/or AE instruction;Patient/family education;Therapeutic activities   OT Goals Acute Rehab OT Goals OT Goal Formulation: With patient Time For Goal Achievement: 11/26/12 Potential to Achieve Goals: Good ADL Goals Pt Will Perform Grooming: with min assist;Standing at sink (min guard) ADL Goal: Grooming - Progress: Goal set today Pt Will Perform Lower Body Bathing: with min assist;with adaptive equipment;Sit to stand from chair;Sit to stand from bed ADL Goal: Lower Body Bathing - Progress: Goal set today Pt Will Perform Lower Body Dressing: with min assist;Sit to stand from chair;Sit to stand from bed;with adaptive equipment ADL Goal: Lower Body Dressing - Progress: Goal set today Pt Will Transfer to Toilet: with min assist;Ambulation;3-in-1 (min guard) ADL Goal: Toilet Transfer - Progress: Goal set today Pt Will Perform Toileting - Clothing Manipulation: with min assist (min guard) ADL Goal: Toileting - Clothing Manipulation - Progress: Goal set today  Visit Information  Last OT Received On: 11/19/12 Assistance Needed: +2 PT/OT Co-Evaluation/Treatment: Yes    Subjective Data  Subjective: I know I need to get up Patient Stated Goal: be as independent as possible   Prior Functioning     Home Living Lives With: Alone Available Help at Discharge: Family (not sure that they can be there 24/7) Type of Home: House Home Layout: One  level Bathroom Shower/Tub: Engineer, manufacturing systems: Handicapped height Home  Adaptive Equipment: Bedside commode/3-in-1;Shower chair with back;Straight cane;Walker - rolling Prior Function Level of Independence: Independent with assistive device(s) (used cane PTA) Communication Communication: No difficulties         Vision/Perception     Cognition  Cognition Arousal/Alertness: Awake/alert Behavior During Therapy: WFL for tasks assessed/performed Overall Cognitive Status: Within Functional Limits for tasks assessed    Extremity/Trunk Assessment Right Upper Extremity Assessment RUE ROM/Strength/Tone: WFL for tasks assessed Left Upper Extremity Assessment LUE ROM/Strength/Tone: WFL for tasks assessed     Mobility Bed Mobility Bed Mobility: Supine to Sit Supine to Sit: 1: +2 Total assist Supine to Sit: Patient Percentage: 70% Details for Bed Mobility Assistance: assist for L LE off the bed and trunk to upright Transfers Transfers: Sit to Stand;Stand to Sit Sit to Stand: 1: +2 Total assist;With upper extremity assist;From bed Sit to Stand: Patient Percentage: 70% Stand to Sit: 1: +2 Total assist;With upper extremity assist;To chair/3-in-1 Stand to Sit: Patient Percentage: 70% Details for Transfer Assistance: verbal cues for hand placement and L LE management.      Exercise     Balance     End of Session OT - End of Session Equipment Utilized During Treatment: Gait belt Activity Tolerance: Patient tolerated treatment well Patient left: in chair;with call bell/phone within reach  GO     Lennox Laity [phone] 11/19/2012, 1:11 PM

## 2012-11-19 NOTE — Evaluation (Signed)
Physical Therapy Evaluation Patient Details Name: LISANNE PONCE MRN: [id number] DOB: 1940-11-19 Today's Date: 11/19/2012 Time: 4098-1191 PT Time Calculation (min): 33 min  PT Assessment / Plan / Recommendation Clinical Impression  Pt s/p L THR presents with decresased L LE strength/ROM and post op pain limiting functional mobility    PT Assessment  Patient needs continued PT services    Follow Up Recommendations  Home health PT;SNF (Dependent on acute stay progress and home assist level avail)    Does the patient have the potential to tolerate intense rehabilitation      Barriers to Discharge Decreased caregiver support      Equipment Recommendations  None recommended by PT    Recommendations for Other Services OT consult   Frequency 7X/week    Precautions / Restrictions Precautions Precautions: None Precaution Comments: direct anterior THA Restrictions Weight Bearing Restrictions: No Other Position/Activity Restrictions: WBAT   Pertinent Vitals/Pain      Mobility  Bed Mobility Bed Mobility: Supine to Sit Supine to Sit: 1: +2 Total assist Supine to Sit: Patient Percentage: 70% Sitting - Scoot to Edge of Bed: 4: Min assist Details for Bed Mobility Assistance: assist for L LE off the bed and trunk to upright Transfers Transfers: Sit to Stand;Stand to Sit Sit to Stand: 1: +2 Total assist;With upper extremity assist;From bed Sit to Stand: Patient Percentage: 70% Stand to Sit: 1: +2 Total assist;With upper extremity assist;To chair/3-in-1 Stand to Sit: Patient Percentage: 70% Details for Transfer Assistance: verbal cues for hand placement and L LE management.  Ambulation/Gait Ambulation/Gait Assistance: 1: +2 Total assist Ambulation/Gait: Patient Percentage: 70% Ambulation Distance (Feet): 34 Feet Assistive device: Rolling walker Ambulation/Gait Assistance Details: cues for sequence, posture, position from RW  and ER on L Gait Pattern: Step-to  pattern;Step-through pattern;Festinating;Trunk flexed Stairs: No    Exercises Total Joint Exercises Ankle Circles/Pumps: AROM;15 reps;Supine;Both Quad Sets: AROM;10 reps;Supine;Both Heel Slides: AAROM;15 reps;Supine;Left Hip ABduction/ADduction: AAROM;10 reps;Supine;Left   PT Diagnosis: Difficulty walking  PT Problem List: Decreased strength;Decreased range of motion;Decreased activity tolerance;Decreased mobility;Decreased knowledge of use of DME;Obesity;Pain PT Treatment Interventions: DME instruction;Stair training;Gait training;Functional mobility training;Therapeutic activities;Therapeutic exercise;Patient/family education   PT Goals Acute Rehab PT Goals PT Goal Formulation: With patient Time For Goal Achievement: 11/25/12 Potential to Achieve Goals: Good Pt will go Supine/Side to Sit: with modified independence PT Goal: Supine/Side to Sit - Progress: Goal set today Pt will go Sit to Supine/Side: with modified independence PT Goal: Sit to Supine/Side - Progress: Goal set today Pt will go Sit to Stand: with modified independence PT Goal: Sit to Stand - Progress: Goal set today Pt will go Stand to Sit: with modified independence PT Goal: Stand to Sit - Progress: Goal set today Pt will Ambulate: 51 - 150 feet;with modified independence;with rolling walker PT Goal: Ambulate - Progress: Goal set today Pt will Go Up / Down Stairs: 3-5 stairs;with min assist;with least restrictive assistive device PT Goal: Up/Down Stairs - Progress: Goal set today  Visit Information  Last PT Received On: 11/19/12 Assistance Needed: +2 PT/OT Co-Evaluation/Treatment: Yes    Subjective Data  Subjective: When I get home, I am by myself, I lost my husband 9 months ago and you can't count on anyone else to be there Patient Stated Goal: Home but willing to consider rehab first dependent on acute stay progress   Prior Functioning  Home Living Lives With: Alone Available Help at Discharge: Family (not  sure that they can be there 24/7) Type of Home: House  Home Access: Stairs to enter Entrance Stairs-Number of Steps: 4 Entrance Stairs-Rails: Right Home Layout: One level Bathroom Shower/Tub: Engineer, manufacturing systems: Handicapped height Home Adaptive Equipment: Bedside commode/3-in-1;Shower chair with back;Straight cane;Walker - rolling Prior Function Level of Independence: Independent with assistive device(s) (used cane PTA) Able to Take Stairs?: Yes Driving: Yes Vocation: Retired Musician: No difficulties    Copywriter, advertising Arousal/Alertness: Awake/alert Behavior During Therapy: WFL for tasks assessed/performed Overall Cognitive Status: Within Functional Limits for tasks assessed    Extremity/Trunk Assessment Right Upper Extremity Assessment RUE ROM/Strength/Tone: WFL for tasks assessed Left Upper Extremity Assessment LUE ROM/Strength/Tone: WFL for tasks assessed Right Lower Extremity Assessment RLE ROM/Strength/Tone: [facility] for tasks assessed Left Lower Extremity Assessment LLE ROM/Strength/Tone: Deficits LLE ROM/Strength/Tone Deficits: Hip strength 2+/5 with AAROM at hip to 80 flex and 20 abd Trunk Assessment Trunk Assessment: Normal   Balance    End of Session PT - End of Session Equipment Utilized During Treatment: Gait belt Activity Tolerance: Patient tolerated treatment well Patient left: in chair;with call bell/phone within reach Nurse Communication: Mobility status  GP     Tuyen Uncapher 11/19/2012, 1:26 PM

## 2012-11-19 NOTE — Care Management (Signed)
Cm spoke with pt concerning discharge planning. Pt states dc plan is REHAB due to pt living home alone. CSW to consult. Will follow if dc plans change, and pt able to arrange home supervision other than [facility] agency.   Leonie Green [phone]

## 2012-11-19 NOTE — Op Note (Signed)
[name], [name] NO.:  [id number]  MEDICAL RECORD NO.:  [id number]  LOCATION:  1621                         FACILITY:  [facility]  PHYSICIAN:  Vanita Panda. Magnus Ivan, M.D.DATE OF BIRTH:  [birth year]  DATE OF PROCEDURE:  11/18/2012 DATE OF DISCHARGE:                              OPERATIVE REPORT   PREOPERATIVE DIAGNOSIS:  Severe end-stage arthritis and degenerative joint disease, left hip.  POSTOPERATIVE DIAGNOSIS:  Severe end-stage arthritis and degenerative joint disease, left hip.  PROCEDURE:  Left total hip arthroplasty through direct anterior approach.  IMPLANTS:  DePuy Sector Gription acetabular component size 50, apex hole eliminator guide, size 32+, 4 neutral polyethylene liner, size 12 Corail femoral component with varus offset (KLA), size 32+, 1 metal hip ball.  SURGEON:  Vanita Panda. Magnus Ivan, M.D.  ASSISTANT:  Richardean Canal, P.A.  ANESTHESIA:  Spinal.  ESTIMATED BLOOD LOSS:  350 mL.  ANTIBIOTICS:  2 g IV Ancef.  COMPLICATIONS:  None.  INDICATIONS:  [name] is a very pleasant 72 year old female with severe debilitating arthritis involving her left hip.  It has gotten to where it affects her activities of daily living.  Her mobility is very limited.  Her pain is daily.  Her quality of life has diminished.  She wished to proceed with total hip arthroplasty given the failure of conservative treatment including injections, anti-inflammatories, walking with assistive device sometime.  PROCEDURE DESCRIPTION:  After informed consent was obtained, appropriate left hip was marked.  She was brought to the operating room, and while she was on her stretcher,  spinal anesthesia was obtained.  She was then laid back in the supine position, a Foley catheter was placed and then both feet had traction boots applied to them.  She was then placed supine on the Hana fracture table with the perineal post in place and both legs in inline skeletal  traction but no traction applied.  Her left operative hip was then prepped and draped with DuraPrep and sterile drapes.  Time-out was called to identify the correct patient, correct left hip.  We then made an incision just inferior and posterior to the anterior-superior iliac spine and carried this obliquely down the leg. We dissected down to the tensor fascia lata and the tensor fascia was divided longitudinally.  I then proceeded with a direct anterior approach to the hip.  Retractor was placed around the lateral neck and up underneath the rectus femoris, a medial retractor was placed.  I coagulated the lateral femoral circumflex vessels.  I then opened up the hip capsule in a L type format.  I placed the Cobra retractors within the hip capsule.  Next, I made my femoral neck cut just proximal to the lesser trochanter using oscillating saw and completed this with an osteotome.  I placed a corkscrew guide in the femoral head and removed the femoral head in its entirety.  I then removed peripheral osteophytes around the acetabulum and cleaned the acetabular debris.  I placed a bent Hohmann medially and a Cobra retractor laterally.  I then began reaming from size 42 reamer in 2 mm increments up to a size 50 with all reamers placed under direct visualization and a last reamer also placed  under direct fluoroscopy so I could obtain my depth by bring my inclination and anteversion.  Once I was pleased with this.  I placed the real DePuy Sector Gription acetabular component.  I verified displacement under direct fluoroscopy.  I placed the apex hole eliminator guide and then the real 32+, 4 neutral polyethylene liner. With the leg externally rotated to 90 degrees, extended in adduction, no traction applied.  We then gained access to the femoral canal.  A medial retractor was placed medially and a bent Hohmann behind the greater trochanter.  I released the lateral hip capsule and then used a  box cutting guide and a rongeur to open up the femoral canal.  I then began broaching from a size 8 broach all the way to a size 12.  For the condition of the size 12 broach, I used a calcar planer to plane at the neck and then placed a trial KLA varus offset neck.  I trialed a 32+ 1 hip ball, and brought the leg back over and up with traction and internal rotation, reduced in the acetabulum and was stable with internal and external rotation with minimal shuck and her leg lengths were measured near equal under fluoroscopy.  We then dislocated the hip and removed the trial components.  I placed the real size 12 Corail femoral component with varus offset and the real 32+ 1 metal hip ball. We again reduced this in the acetabulum and it was stable.  We copiously irrigated the soft tissues with normal saline solution using pulsatile lavage.  We closed the joint capsule with interrupted #1 Ethibond suture followed by a running #1 Vicryl in the tensor fascia, 0 Vicryl in the deep tissue, 2-0 Vicryl in subcutaneous tissue, 4-0 Monocryl subcuticular stitch, and Dermabond on the skin.  A well-padded Aquacel dressing was applied.  She was taken off the Hana table into the recovery room in stable condition.  All final counts correct.  There were no complications noted.  Of note, Richardean Canal, P.A., was present through the entire case and his presence was crucial for exposure of the hip, joint retracting, and closure the wound.     Vanita Panda. Magnus Ivan, M.D.     CYB/MEDQ  D:  11/18/2012  T:  11/19/2012  Job:  [id number]

## 2012-11-19 NOTE — Progress Notes (Signed)
Subjective: 1 Day Post-Op Procedure(s) (LRB): LEFT TOTAL HIP ARTHROPLASTY ANTERIOR APPROACH (Left) Patient reports pain as moderate.  Hypertensive.  Asymptomatic acute blood loss anemia. Constipated.  Objective: Vital signs in last 24 hours: Temp:  [97.9 F (36.6 C)-98.9 F (37.2 C)] 98.6 F (37 C) (04/26 0600) Pulse Rate:  [70-85] 85 (04/26 0600) Resp:  [12-95] 18 (04/26 0600) BP: (124-198)/(53-99) 195/79 mmHg (04/26 0600) SpO2:  [20 %-100 %] 100 % (04/26 0600) Weight:  [103.42 kg (228 lb)] 103.42 kg (228 lb) (04/25 1500)  Intake/Output from previous day: 04/25 0701 - 04/26 0700 In: 2687.5 [P.O.:240; I.V.:2447.5] Out: 1445 [Urine:1120; Blood:325] Intake/Output this shift:     Recent Labs  11/16/12 1110 11/19/12 0500  HGB 11.7* 9.4*    Recent Labs  11/16/12 1110 11/19/12 0500  WBC 9.1 10.5  RBC 3.91 3.11*  HCT 35.4* 28.1*  PLT 307 233    Recent Labs  11/16/12 1110 11/19/12 0500  NA 139 139  K 3.6 3.5  CL 102 104  CO2 27 25  BUN 18 16  CREATININE 0.96 0.99  GLUCOSE 185* 137*  CALCIUM 10.3 8.8    Recent Labs  11/16/12 1110  INR 1.24    Sensation intact distally Intact pulses distally Dorsiflexion/Plantar flexion intact Incision: dressing C/D/I  Assessment/Plan: 1 Day Post-Op Procedure(s) (LRB): LEFT TOTAL HIP ARTHROPLASTY ANTERIOR APPROACH (Left) Up with therapy  Clyda Smyth Y 11/19/2012, 10:52 AM

## 2012-11-20 LAB — CBC
HCT: 25.7 % — ABNORMAL LOW (ref 36.0–46.0)
MCHC: 33.1 g/dL (ref 30.0–36.0)
MCV: 91.1 fL (ref 78.0–100.0)
Platelets: 249 10*3/uL (ref 150–400)
RDW: 13.8 % (ref 11.5–15.5)

## 2012-11-20 LAB — GLUCOSE, CAPILLARY
Glucose-Capillary: 205 mg/dL — ABNORMAL HIGH (ref 70–99)
Glucose-Capillary: 186 mg/dL — ABNORMAL HIGH (ref 70–99)

## 2012-11-20 NOTE — Progress Notes (Signed)
Patient ID: [name], female   DOB: September 17, 1940, 72 y.o.   MRN: [id number] PATIENT ID: [name]        MRN:  [id number]          DOB/AGE: Jun 19, 1941 / 72 y.o.    Norlene Campbell, MD   Jacqualine Code, PA-C [address] [place], [place]  [address]                             [phone]   PROGRESS NOTE  Subjective:  negative for Chest Pain  negative for Shortness of Breath  negative for Nausea/Vomiting   negative for Calf Pain    Tolerating Diet: yes         Patient reports pain as mild.     Mild "soreness" in left calf,negative Homan's, no LE edema-monitor             Appetite " not great" but no nausea, comfortable  Objective: Vital signs in last 24 hours:   Patient Vitals for the past 24 hrs:  BP Temp Temp src Pulse Resp SpO2  11/20/12 0614 152/79 mmHg 99.2 F (37.3 C) Oral 83 20 98 %  11/20/12 0400 - - - - 16 98 %  11/20/12 0020 146/75 mmHg 99.6 F (37.6 C) Oral 87 16 96 %  11/20/12 0000 - - - - 14 98 %  11/19/12 2242 143/74 mmHg 100 F (37.8 C) Oral 92 20 97 %  11/19/12 2000 - - - - 16 97 %  11/19/12 1800 160/66 mmHg 99.1 F (37.3 C) Oral 99 18 98 %  11/19/12 1400 154/56 mmHg 99.2 F (37.3 C) Oral 106 18 99 %      Intake/Output from previous day:   04/26 0701 - 04/27 0700 In: 2998.8 [P.O.:330; I.V.:2668.8] Out: 1850 [Urine:1850]   Intake/Output this shift:   04/27 0701 - 04/27 1900 In: 311.3 [I.V.:311.3] Out: -    Intake/Output     04/26 0701 - 04/27 0700 04/27 0701 - 04/28 0700   P.O. 330    I.V. (mL/kg) 2668.8 (25.8) 311.3 (3)   Total Intake(mL/kg) 2998.8 (29) 311.3 (3)   Urine (mL/kg/hr) 1850 (0.7)    Blood     Total Output 1850     Net +1148.8 +311.3        Urine Occurrence 4 x    Stool Occurrence        LABORATORY DATA:  Recent Labs  11/16/12 1110 11/19/12 0500 11/20/12 0500  WBC 9.1 10.5 13.5*  HGB 11.7* 9.4* 8.5*  HCT 35.4* 28.1* 25.7*  PLT 307 233 249    Recent Labs  11/16/12 1110 11/19/12 0500  NA 139  139  K 3.6 3.5  CL 102 104  CO2 27 25  BUN 18 16  CREATININE 0.96 0.99  GLUCOSE 185* 137*  CALCIUM 10.3 8.8   Lab Results  Component Value Date   INR 1.24 11/16/2012   INR 3.0* 09/27/2007   INR 2.1* 09/26/2007    Recent Radiographic Studies :  Dg Chest 2 View  11/16/2012  *RADIOLOGY REPORT*  Clinical Data: Preop left hip replacement  CHEST - 2 VIEW  Comparison: None.  Findings: Normal mediastinum and heart silhouette.  The aorta is ectatic.  There is mild bronchitic change centrally.  No effusion, infiltrate, or pneumothorax.  IMPRESSION:  1.  Bronchitic change. 2.  No acute cardiopulmonary findings.   Original Report Authenticated By: Genevive Bi, M.D.  Dg Hip Complete Left  11/18/2012  *RADIOLOGY REPORT*  Clinical Data: Left total arthroplasty  DG C-ARM 61-120 MIN - NRPT [facility],LEFT HIP - COMPLETE 2+ VIEW  Comparison: 08/15/2010  Findings: Two intraoperative fluoroscopic images demonstrate expected intraoperative appearance of left total hip arthroplasty.  IMPRESSION: Expected intraoperative appearance of left total hip arthroplasty.   Original Report Authenticated By: Christiana Pellant, M.D.    Dg Pelvis Portable  11/18/2012  *RADIOLOGY REPORT*  Clinical Data: Postop  PORTABLE PELVIS  Comparison: None.  Findings: Left total hip arthroplasty has been performed.  Anatomic alignment of the osseous and prosthetic structures.  No breakage or loosening of the hardware.  IMPRESSION: Left total hip arthroplasty anatomically aligned.   Original Report Authenticated By: Jolaine Click, M.D.    Dg Hip Portable 1 View Left  11/18/2012  *RADIOLOGY REPORT*  Clinical Data: Left total hip replacement.  PORTABLE LEFT HIP - 1 VIEW  Comparison: Portable pelvis obtained at the same time.  Findings: A left total hip prosthesis is again demonstrated.  There is a fracture through the proximal aspect of the left greater trochanter with mild distraction of the fragments.  IMPRESSION: Left total hip prosthesis with a  minimally displaced fracture through the superior aspect of the left greater trochanter.  This will be called to the PACU.   Original Report Authenticated By: Beckie Salts, M.D.    Dg C-arm 61-120 Min-no Report  11/18/2012  *RADIOLOGY REPORT*  Clinical Data: Left total arthroplasty  DG C-ARM 61-120 MIN - NRPT [facility],LEFT HIP - COMPLETE 2+ VIEW  Comparison: 08/15/2010  Findings: Two intraoperative fluoroscopic images demonstrate expected intraoperative appearance of left total hip arthroplasty.  IMPRESSION: Expected intraoperative appearance of left total hip arthroplasty.   Original Report Authenticated By: Christiana Pellant, M.D.      Examination:  General appearance: alert, cooperative and no distress  Wound Exam: clean, dry, intact   Drainage:  None: wound tissue dry  Motor Exam: EHL, FHL, Anterior Tibial and Posterior Tibial Intact  Sensory Exam: Superficial Peroneal, Deep Peroneal and Tibial normal  Vascular Exam: Normal  Assessment:    2 Days Post-Op  Procedure(s) (LRB): LEFT TOTAL HIP ARTHROPLASTY ANTERIOR APPROACH (Left)  ADDITIONAL DIAGNOSIS:  Principal Problem:   Degenerative arthritis of hip  Acute Blood Loss Anemia-asymptomatic   Plan: Physical Therapy as ordered Weight Bearing as Tolerated (WBAT)  DVT Prophylaxis:  Aspirin  DISCHARGE PLAN: Inpatient Rehab   DISCHARGE NEEDS: HHPT and Walker   continue PT, work on rehab placement, KVO IV      [name] 11/20/2012 11:25 AM

## 2012-11-20 NOTE — Progress Notes (Signed)
Clinical Social Work Department BRIEF PSYCHOSOCIAL ASSESSMENT 11/20/2012  Patient:  MAXIE, DEBOSE     Account Number:  [id number]     Admit date:  11/18/2012  Clinical Social Worker:  Doroteo Glassman  Date/Time:  11/20/2012 01:44 PM  Referred by:  Physician  Date Referred:  11/20/2012 Referred for  SNF Placement   Other Referral:   Interview type:  Patient Other interview type:    PSYCHOSOCIAL DATA Living Status:  ALONE Admitted from facility:   Level of care:   Primary support name:  Levie Heritage Primary support relationship to patient:  CHILD, ADULT Degree of support available:   unknown    CURRENT CONCERNS Current Concerns  Post-Acute Placement   Other Concerns:    SOCIAL WORK ASSESSMENT / PLAN Met with Pt to discuss d/c plans.    Pt stated that she will need SNF, as her husband died 1 year ago and she now lives alone; she realizes that she will need assistance in the form of rehab.    Pt resides in [facility] and is requesting [facility] in [place].  Pt declined SNF list.    CSW thanked Pt for her time.   Assessment/plan status:   Other assessment/ plan:   Information/referral to community resources:   Pt declined, as she would like [facility]    PATIENT'S/FAMILY'S RESPONSE TO PLAN OF CARE: Pt thanked CSW for time and assistance.   Providence Crosby, LCSWA Clinical Social Work [phone]

## 2012-11-20 NOTE — Progress Notes (Addendum)
Clinical Social Work Department CLINICAL SOCIAL WORK PLACEMENT NOTE 11/20/2012  Patient:  [name], [name]  Account Number:  [id number] Admit date:  11/18/2012  Clinical Social Worker:  Doroteo Glassman  Date/time:  11/20/2012 01:48 PM  Clinical Social Work is seeking post-discharge placement for this patient at the following level of care:   SKILLED NURSING   (*CSW will update this form in Epic as items are completed)   Declined--only wants [facility] [place]  Patient/family provided with [facility] Department of Clinical Social Work's list of facilities offering this level of care within the geographic area requested by the patient (or if unable, by the patient's family).  11/20/2012  Patient/family informed of their freedom to choose among providers that offer the needed level of care, that participate in Medicare, Medicaid or managed care program needed by the patient, have an available bed and are willing to accept the patient.  11/20/2012  Patient/family informed of [facility]' ownership interest in [facility], as well as of the fact that they are under no obligation to receive care at this facility.  PASARR submitted to EDS on 11/18/2012 PASARR number received from [employer] on 11/18/2012  FL2 transmitted to all facilities in geographic area requested by pt/family on  11/20/2012 FL2 transmitted to all facilities within larger geographic area on   Patient informed that his/her managed care company has contracts with or will negotiate with  certain facilities, including the following:     Patient/family informed of bed offers received:  11/21/12 Patient chooses bed at [facility] Physician recommends and patient chooses bed at    Patient to be transferred to [facility] on  11/21/12 Patient to be transferred to facility by  [facility]  The following physician request were entered in Epic:   Additional Comments:  Providence Crosby, Theresia Majors Clinical Social  Work [phone]

## 2012-11-20 NOTE — Progress Notes (Signed)
Pt has had episodes of high CBG's.  May need order for SS insulin.

## 2012-11-20 NOTE — Progress Notes (Signed)
OT Cancellation Note  Patient Details Name: [name] MRN: [id number] DOB: 1941/07/25   Cancelled Treatment:    Reason Eval/Treat Not Completed: Fatigue/lethargy limiting ability to participate  Jeani Hawking M 11/20/2012, 12:37 PM

## 2012-11-20 NOTE — Progress Notes (Signed)
Physical Therapy Treatment Patient Details Name: [name] MRN: [id number] DOB: [birth year] Today's Date: 11/20/2012 Time: 4098-1191 PT Time Calculation (min): 23 min  PT Assessment / Plan / Recommendation Comments on Treatment Session  Progressing with mobility. Pt is still undecided about d/c plan at this time. Feel pt may benefit from ST rehab since she lives alone.     Follow Up Recommendations  Home health PT;SNF     Does the patient have the potential to tolerate intense rehabilitation     Barriers to Discharge        Equipment Recommendations  None recommended by PT    Recommendations for Other Services OT consult  Frequency 7X/week   Plan Discharge plan remains appropriate    Precautions / Restrictions Precautions Precautions: None Restrictions Weight Bearing Restrictions: No LLE Weight Bearing: Weight bearing as tolerated   Pertinent Vitals/Pain 7/10 L LE    Mobility  Bed Mobility Bed Mobility: Supine to Sit Supine to Sit: 3: Mod assist;HOB elevated Details for Bed Mobility Assistance: Increased time . Assist for L LE off bed.  Transfers Transfers: Stand to Sit Sit to Stand: 3: Mod assist;From bed Stand to Sit: 4: Min assist;To chair/3-in-1 Details for Transfer Assistance: VCS safety, technique, hand placement. Assist to rise, stabilize, control descent.  Ambulation/Gait Ambulation/Gait Assistance: 4: Min assist Ambulation Distance (Feet): 75 Feet Assistive device: Rolling walker Ambulation/Gait Assistance Details: VCS safety, technqiue, sequence, posture. Pt able to progress to reciprocal pattern while walking back to room. Difficulty with L LE swing through.  Gait Pattern: Step-to pattern;Step-through pattern;Trunk flexed    Exercises     PT Diagnosis:    PT Problem List:   PT Treatment Interventions:     PT Goals Acute Rehab PT Goals Pt will go Supine/Side to Sit: with modified independence PT Goal: Supine/Side to Sit - Progress:  Progressing toward goal Pt will go Sit to Stand: with modified independence PT Goal: Sit to Stand - Progress: Progressing toward goal Pt will go Stand to Sit: with modified independence PT Goal: Stand to Sit - Progress: Progressing toward goal Pt will Ambulate: 51 - 150 feet;with modified independence;with rolling walker PT Goal: Ambulate - Progress: Progressing toward goal  Visit Information  Last PT Received On: 11/20/12 Assistance Needed: +1    Subjective Data  Subjective: I don't know what Im going to do yet Patient Stated Goal: Home but willing to consider rehab first dependent on acute stay progress   Cognition  Cognition Arousal/Alertness: Awake/alert Behavior During Therapy: WFL for tasks assessed/performed Overall Cognitive Status: Within Functional Limits for tasks assessed    Balance     End of Session PT - End of Session Equipment Utilized During Treatment: Gait belt Activity Tolerance: Patient tolerated treatment well Patient left: in chair;with call bell/phone within reach   GP     Rebeca Alert, MPT Pager: [phone]

## 2012-11-20 NOTE — Progress Notes (Signed)
Physical Therapy Treatment Patient Details Name: [name] MRN: [id number] DOB: 07-04-1941 Today's Date: 11/20/2012 Time: 4098-1191 PT Time Calculation (min): 16 min  PT Assessment / Plan / Recommendation Comments on Treatment Session  Progressing with mobility. Plan is for d/c to rehab on tomorrow.     Follow Up Recommendations  SNF     Does the patient have the potential to tolerate intense rehabilitation     Barriers to Discharge        Equipment Recommendations  None recommended by PT    Recommendations for Other Services OT consult  Frequency 7X/week   Plan Discharge plan needs to be updated    Precautions / Restrictions Precautions Precautions: None Restrictions Weight Bearing Restrictions: No LLE Weight Bearing: Weight bearing as tolerated   Pertinent Vitals/Pain L hip "sore"    Mobility  Bed Mobility Bed Mobility: Supine to Sit Supine to Sit: 4: Min assist Details for Bed Mobility Assistance: Increased time . Assist for L LE off bed.  Transfers Transfers: Sit to Stand;Stand to Sit Sit to Stand: 3: Mod assist;From bed;From elevated surface;From chair/3-in-1 Stand to Sit: 4: Min assist;To chair/3-in-1 Details for Transfer Assistance: VCS safety, technique, hand placement. Assist to rise, stabilize, control descent.  Ambulation/Gait Ambulation/Gait Assistance: 4: Min assist Ambulation Distance (Feet): 80 Feet Assistive device: Rolling walker Ambulation/Gait Assistance Details: VCs safety, technique, sequence, posture.  Gait Pattern: Step-to pattern;Step-through pattern;Trunk flexed    Exercises     PT Diagnosis:    PT Problem List:   PT Treatment Interventions:     PT Goals Acute Rehab PT Goals Pt will go Supine/Side to Sit: with modified independence PT Goal: Supine/Side to Sit - Progress: Progressing toward goal Pt will go Sit to Stand: with modified independence PT Goal: Sit to Stand - Progress: Progressing toward goal Pt will go Stand to  Sit: with modified independence PT Goal: Stand to Sit - Progress: Progressing toward goal Pt will Ambulate: 51 - 150 feet;with modified independence;with rolling walker PT Goal: Ambulate - Progress: Progressing toward goal  Visit Information  Last PT Received On: 11/20/12 Assistance Needed: +1    Subjective Data  Subjective: Im going to rehab tomorrow Patient Stated Goal: rehab. regain independence   Cognition       Balance     End of Session PT - End of Session Activity Tolerance: Patient tolerated treatment well Patient left: in chair;with call bell/phone within reach;with family/visitor present   GP     Rebeca Alert, MPT Pager: [phone]

## 2012-11-21 LAB — CBC
HCT: 25.2 % — ABNORMAL LOW (ref 36.0–46.0)
Hemoglobin: 8.3 g/dL — ABNORMAL LOW (ref 12.0–15.0)
WBC: 13.5 10*3/uL — ABNORMAL HIGH (ref 4.0–10.5)

## 2012-11-21 LAB — GLUCOSE, CAPILLARY
Glucose-Capillary: 171 mg/dL — ABNORMAL HIGH (ref 70–99)
Glucose-Capillary: 258 mg/dL — ABNORMAL HIGH (ref 70–99)

## 2012-11-21 MED ORDER — METHOCARBAMOL 500 MG PO TABS: 500.0000 mg | ORAL_TABLET | Freq: Four times a day (QID) | ORAL | Status: DC | PRN

## 2012-11-21 MED ORDER — OXYCODONE-ACETAMINOPHEN 5-325 MG PO TABS: 1.0000 | ORAL_TABLET | ORAL | Status: DC | PRN

## 2012-11-21 MED ORDER — ASPIRIN 325 MG PO TBEC: 325.0000 mg | DELAYED_RELEASE_TABLET | Freq: Two times a day (BID) | ORAL | Status: DC

## 2012-11-21 NOTE — Progress Notes (Addendum)
Clinical Social Work  CSW received authorization from [employer] [id number]). DC summary faxed to [facility] who is agreeable to admission today. CSW prepared DC packet with FL2 and hard scripts included. CSW informed patient who called family. RN agreeable to dc. CSW coordinated transportation via [place] (service request [phone]) . CSW is signing off but available if needed.  Unk Lightning, LCSW (Coverage for [employer])

## 2012-11-21 NOTE — Progress Notes (Signed)
Clinical Social Work  CSW spoke with [employer] who reports they can give authorization once facility is chosen. CSW called [facility] who reports they are reviewing information and will call CSW once they have decided if they can accept patient. CSW will continue to follow.  Unk Lightning, LCSW (Coverage for [employer])

## 2012-11-21 NOTE — Progress Notes (Signed)
Subjective: 3 Days Post-Op Procedure(s) (LRB): LEFT TOTAL HIP ARTHROPLASTY ANTERIOR APPROACH (Left) Patient reports pain as mild.  Asymptomatic acute blood loss anemia.  Objective: Vital signs in last 24 hours: Temp:  [97.6 F (36.4 C)-100.4 F (38 C)] 97.6 F (36.4 C) (04/28 0510) Pulse Rate:  [90-95] 90 (04/28 0510) Resp:  [16-20] 16 (04/28 0510) BP: (130-157)/(67-72) 157/72 mmHg (04/28 0510) SpO2:  [96 %-99 %] 98 % (04/28 0510)  Intake/Output from previous day: 04/27 0701 - 04/28 0700 In: 2113.8 [P.O.:300; I.V.:1813.8] Out: 650 [Urine:650] Intake/Output this shift:     Recent Labs  11/19/12 0500 11/20/12 0500 11/21/12 0430  HGB 9.4* 8.5* 8.3*    Recent Labs  11/20/12 0500 11/21/12 0430  WBC 13.5* 13.5*  RBC 2.82* 2.78*  HCT 25.7* 25.2*  PLT 249 218    Recent Labs  11/19/12 0500  NA 139  K 3.5  CL 104  CO2 25  BUN 16  CREATININE 0.99  GLUCOSE 137*  CALCIUM 8.8   No results found for this basename: LABPT, INR,  in the last 72 hours  Sensation intact distally Intact pulses distally Dorsiflexion/Plantar flexion intact Incision: dressing C/D/I No cellulitis present Compartment soft  Assessment/Plan: 3 Days Post-Op Procedure(s) (LRB): LEFT TOTAL HIP ARTHROPLASTY ANTERIOR APPROACH (Left) Discharge to SNF  Abdur Hoglund Y 11/21/2012, 7:18 AM

## 2012-11-21 NOTE — Discharge Summary (Signed)
Patient ID: [name] MRN: [id number] DOB/AGE: Oct 01, 1940 72 y.o.  Admit date: 11/18/2012 Discharge date: 11/21/2012  Admission Diagnoses:  Principal Problem:   Degenerative arthritis of hip   Discharge Diagnoses:  Same  Past Medical History  Diagnosis Date  . Hypertension   . Lung fibrosis   . Diabetes mellitus without complication   . Headache     occasional headache   . Arthritis     Surgeries: Procedure(s): LEFT TOTAL HIP ARTHROPLASTY ANTERIOR APPROACH on 11/18/2012   Consultants:    Discharged Condition: Improved  Hospital Course: [name] is an 72 y.o. female who was admitted 11/18/2012 for operative treatment ofDegenerative arthritis of hip. Patient has severe unremitting pain that affects sleep, daily activities, and work/hobbies. After pre-op clearance the patient was taken to the operating room on 11/18/2012 and underwent  Procedure(s): LEFT TOTAL HIP ARTHROPLASTY ANTERIOR APPROACH.    Patient was given perioperative antibiotics: Anti-infectives   Start     Dose/Rate Route Frequency Ordered Stop   11/18/12 1600  ceFAZolin (ANCEF) IVPB 1 g/50 mL premix     1 g 100 mL/hr over 30 Minutes Intravenous Every 6 hours 11/18/12 1410 11/18/12 2154   11/18/12 0717  ceFAZolin (ANCEF) IVPB 2 g/50 mL premix     2 g 100 mL/hr over 30 Minutes Intravenous On call to O.R. 11/18/12 0718 11/18/12 1018       Patient was given sequential compression devices, early ambulation, and chemoprophylaxis to prevent DVT.  Patient benefited maximally from hospital stay and there were no complications.    Recent vital signs: Patient Vitals for the past 24 hrs:  BP Temp Temp src Pulse Resp SpO2  11/21/12 0510 157/72 mmHg 97.6 F (36.4 C) Oral 90 16 98 %  11/21/12 0400 - - - - 18 99 %  11/21/12 0000 - - - - 16 99 %  11/20/12 2252 155/67 mmHg 100.4 F (38 C) Oral 95 20 97 %  11/20/12 2000 - - - - 16 99 %  11/20/12 1400 130/67 mmHg 99.4 F (37.4 C) Oral 93 18 96 %      Recent laboratory studies:  Recent Labs  11/19/12 0500 11/20/12 0500 11/21/12 0430  WBC 10.5 13.5* 13.5*  HGB 9.4* 8.5* 8.3*  HCT 28.1* 25.7* 25.2*  PLT 233 249 218  NA 139  --   --   K 3.5  --   --   CL 104  --   --   CO2 25  --   --   BUN 16  --   --   CREATININE 0.99  --   --   GLUCOSE 137*  --   --   CALCIUM 8.8  --   --      Discharge Medications:     Medication List    STOP taking these medications       aspirin 325 MG tablet     GOODYS EXTRA STRENGTH 500-325-65 MG Pack  Generic drug:  Aspirin-Acetaminophen-Caffeine      TAKE these medications       aspirin 325 MG EC tablet  Take 1 tablet (325 mg total) by mouth 2 (two) times daily.     atorvastatin 40 MG tablet  Commonly known as:  LIPITOR  Take 40 mg by mouth every morning.     bisacodyl 5 MG EC tablet  Commonly known as:  DULCOLAX  Take 5 mg by mouth daily as needed for constipation.     glimepiride 4  MG tablet  Commonly known as:  AMARYL  Take 4 mg by mouth daily before breakfast.     guaiFENesin-codeine 100-10 MG/5ML syrup  Commonly known as:  ROBITUSSIN AC  Take 5 mLs by mouth 3 (three) times daily as needed for cough.     hydrALAZINE 25 MG tablet  Commonly known as:  APRESOLINE  Take 50 mg by mouth daily.     methocarbamol 500 MG tablet  Commonly known as:  ROBAXIN  Take 1 tablet (500 mg total) by mouth every 6 (six) hours as needed.     nebivolol 10 MG tablet  Commonly known as:  BYSTOLIC  Take 10 mg by mouth every morning.     olmesartan-hydrochlorothiazide 40-25 MG per tablet  Commonly known as:  BENICAR HCT  Take 1 tablet by mouth every morning.     ONGLYZA 5 MG Tabs tablet  Generic drug:  saxagliptin HCl  Take 5 mg by mouth every morning.     oxyCODONE-acetaminophen 5-325 MG per tablet  Commonly known as:  ROXICET  Take 1-2 tablets by mouth every 4 (four) hours as needed for pain.     TEKTURNA 150 MG tablet  Generic drug:  aliskiren  Take 150 mg by mouth daily.      Vitamin D (Ergocalciferol) 50000 UNITS Caps  Commonly known as:  DRISDOL  Take 50,000 Units by mouth 2 (two) times a week. On Monday and Thursdays        Diagnostic Studies: Dg Chest 2 View  11/16/2012  *RADIOLOGY REPORT*  Clinical Data: Preop left hip replacement  CHEST - 2 VIEW  Comparison: None.  Findings: Normal mediastinum and heart silhouette.  The aorta is ectatic.  There is mild bronchitic change centrally.  No effusion, infiltrate, or pneumothorax.  IMPRESSION:  1.  Bronchitic change. 2.  No acute cardiopulmonary findings.   Original Report Authenticated By: Genevive Bi, M.D.    Dg Hip Complete Left  11/18/2012  *RADIOLOGY REPORT*  Clinical Data: Left total arthroplasty  DG C-ARM 61-120 MIN - NRPT [facility],LEFT HIP - COMPLETE 2+ VIEW  Comparison: 08/15/2010  Findings: Two intraoperative fluoroscopic images demonstrate expected intraoperative appearance of left total hip arthroplasty.  IMPRESSION: Expected intraoperative appearance of left total hip arthroplasty.   Original Report Authenticated By: Christiana Pellant, M.D.    Dg Pelvis Portable  11/18/2012  *RADIOLOGY REPORT*  Clinical Data: Postop  PORTABLE PELVIS  Comparison: None.  Findings: Left total hip arthroplasty has been performed.  Anatomic alignment of the osseous and prosthetic structures.  No breakage or loosening of the hardware.  IMPRESSION: Left total hip arthroplasty anatomically aligned.   Original Report Authenticated By: Jolaine Click, M.D.    Dg Hip Portable 1 View Left  11/18/2012  *RADIOLOGY REPORT*  Clinical Data: Left total hip replacement.  PORTABLE LEFT HIP - 1 VIEW  Comparison: Portable pelvis obtained at the same time.  Findings: A left total hip prosthesis is again demonstrated.  There is a fracture through the proximal aspect of the left greater trochanter with mild distraction of the fragments.  IMPRESSION: Left total hip prosthesis with a minimally displaced fracture through the superior aspect of the left  greater trochanter.  This will be called to the PACU.   Original Report Authenticated By: Beckie Salts, M.D.    Dg C-arm 61-120 Min-no Report  11/18/2012  *RADIOLOGY REPORT*  Clinical Data: Left total arthroplasty  DG C-ARM 61-120 MIN - [facility]PT [facility],LEFT HIP - COMPLETE 2+ VIEW  Comparison: 08/15/2010  Findings:  Two intraoperative fluoroscopic images demonstrate expected intraoperative appearance of left total hip arthroplasty.  IMPRESSION: Expected intraoperative appearance of left total hip arthroplasty.   Original Report Authenticated By: Christiana Pellant, M.D.     Disposition:   To skilled nursing      Discharge Orders   Future Orders Complete By Expires     Call MD / Call 911  As directed     Comments:      If you experience chest pain or shortness of breath, CALL 911 and be transported to the hospital emergency room.  If you develope a fever above 101 F, pus (white drainage) or increased drainage or redness at the wound, or calf pain, call your surgeon's office.    Constipation Prevention  As directed     Comments:      Drink plenty of fluids.  Prune juice may be helpful.  You may use a stool softener, such as Colace (over the counter) 100 mg twice a day.  Use MiraLax (over the counter) for constipation as needed.    Diet - low sodium heart healthy  As directed     Discharge patient  As directed     Discharge wound care:  As directed     Comments:      Keep dressing clean dry and intact until Wednesday then remove dressing and shower. Apply clean dressing after showering    Increase activity slowly as tolerated  As directed     Weight bearing as tolerated  As directed        Follow-up Information   Follow up with Kathryne Hitch, MD. Schedule an appointment as soon as possible for a visit in 2 weeks.   Contact information:   [address] [place] [address] [phone]        Signed: Kathryne Hitch 11/21/2012, 7:23 AM

## 2012-11-21 NOTE — Progress Notes (Signed)
Physical Therapy Treatment Patient Details Name: [name] MRN: [id number] DOB: 05-29-1941 Today's Date: 11/21/2012 Time: 1191-4782 PT Time Calculation (min): 23 min  PT Assessment / Plan / Recommendation Comments on Treatment Session  Pt is progressing well with mobility, she walked 100' today with supervision. Ready to DC to SNF from PT standpoint.     Follow Up Recommendations  SNF     Does the patient have the potential to tolerate intense rehabilitation     Barriers to Discharge        Equipment Recommendations  None recommended by PT    Recommendations for Other Services OT consult  Frequency 7X/week   Plan Discharge plan remains appropriate    Precautions / Restrictions Precautions Precautions: None Restrictions Weight Bearing Restrictions: No LLE Weight Bearing: Weight bearing as tolerated   Pertinent Vitals/Pain **5-6/10 L hip Ice applied*    Mobility  Bed Mobility Bed Mobility: Sit to Supine Supine to Sit: 4: Min assist;HOB elevated Sit to Supine: 4: Min assist;HOB flat;With rail Details for Bed Mobility Assistance: min assist for L LE Transfers Transfers: Sit to Stand;Stand to Sit Sit to Stand: With upper extremity assist;5: Supervision;From chair/3-in-1 Stand to Sit: With upper extremity assist;5: Supervision;To bed Details for Transfer Assistance: min verbal cues for hand placement Ambulation/Gait Ambulation/Gait Assistance: 5: Supervision Ambulation Distance (Feet): 100 Feet Assistive device: Rolling walker Gait Pattern: Step-to pattern;Step-through pattern;Trunk flexed Gait velocity: decreased General Gait Details: VCs for sequencing and to correct flexed trunk posture    Exercises Total Joint Exercises Ankle Circles/Pumps: AROM;15 reps;Supine;Both Short Arc Quad: AROM;Left;10 reps;Supine Heel Slides: AAROM;15 reps;Supine;Left Hip ABduction/ADduction: AAROM;10 reps;Supine;Left Long Arc Quad: AROM;Left;15 reps;Seated   PT Diagnosis:     PT Problem List:   PT Treatment Interventions:     PT Goals Acute Rehab PT Goals PT Goal Formulation: With patient Time For Goal Achievement: 11/25/12 Pt will go Supine/Side to Sit: with modified independence Pt will go Sit to Supine/Side: with modified independence PT Goal: Sit to Supine/Side - Progress: Progressing toward goal Pt will go Sit to Stand: with modified independence PT Goal: Sit to Stand - Progress: Progressing toward goal Pt will go Stand to Sit: with modified independence PT Goal: Stand to Sit - Progress: Progressing toward goal Pt will Ambulate: 51 - 150 feet;with modified independence;with rolling walker PT Goal: Ambulate - Progress: Progressing toward goal  Visit Information  Last PT Received On: 11/21/12 Assistance Needed: +1    Subjective Data  Subjective: It feels better after walking a bit.  Patient Stated Goal: to be able to help sister with Alzheimers   Cognition  Cognition Arousal/Alertness: Awake/alert Behavior During Therapy: WFL for tasks assessed/performed Overall Cognitive Status: Within Functional Limits for tasks assessed    Balance     End of Session PT - End of Session Activity Tolerance: Patient tolerated treatment well Patient left: with call bell/phone within reach;in bed Nurse Communication: Mobility status   GP     [name] 11/21/2012, 10:58 AM [phone]

## 2012-11-21 NOTE — Progress Notes (Signed)
Occupational Therapy Treatment Patient Details Name: [name] MRN: [id number] DOB: Jan 23, 1941 Today's Date: 11/21/2012 Time: 4098-1191 OT Time Calculation (min): 20 min  OT Assessment / Plan / Recommendation Comments on Treatment Session Pt educated on AE for LB ADL and did well with using it. Pt would benefit from SNF as she lives alone with decreased family availability to assist.     Follow Up Recommendations  Home health OT;SNF;Supervision/Assistance - 24 hour--would benefit from SNF unless family available to assist more.    Barriers to Discharge       Equipment Recommendations  None recommended by OT    Recommendations for Other Services    Frequency Min 2X/week   Plan Discharge plan remains appropriate    Precautions / Restrictions Precautions Precautions: None Restrictions Weight Bearing Restrictions: No LLE Weight Bearing: Weight bearing as tolerated       ADL  Lower Body Dressing: Simulated;Minimal assistance;Other (comment) (with AE) Where Assessed - Lower Body Dressing: Unsupported sitting Toilet Transfer: Simulated;Min guard Toilet Transfer Method: Stand pivot ADL Comments: Pt educated on all AE options. Pt able to doff L sock with reacher with supervision and min verbal cues. She donned sock with sock aid with min assist. Educate don LHS and shoe horn also. Pt really wanting to d/c to [facility] and doesnt have assist at home (family works). Min verbal cues to stay closer to walker and lead with L LE forward first.     OT Diagnosis:    OT Problem List:   OT Treatment Interventions:     OT Goals ADL Goals ADL Goal: Lower Body Dressing - Progress: Progressing toward goals ADL Goal: Toilet Transfer - Progress: Progressing toward goals  Visit Information  Last OT Received On: 11/21/12 Assistance Needed: +1    Subjective Data  Subjective: I got up to the potty earlier Patient Stated Goal: wanting to go to [facility] for rehab   Prior Functioning        Cognition  Cognition Arousal/Alertness: Awake/alert Behavior During Therapy: WFL for tasks assessed/performed Overall Cognitive Status: Within Functional Limits for tasks assessed    Mobility  Bed Mobility Bed Mobility: Supine to Sit Supine to Sit: 4: Min assist;HOB elevated Details for Bed Mobility Assistance: min assist for L LE Transfers Transfers: Sit to Stand;Stand to Sit Sit to Stand: 4: Min guard;With upper extremity assist;From bed Stand to Sit: 4: Min guard;With upper extremity assist;To chair/3-in-1 Details for Transfer Assistance: min verbal cues    Exercises      Balance     End of Session OT - End of Session Activity Tolerance: Patient tolerated treatment well Patient left: in chair;with call bell/phone within reach  GO     Lennox Laity [phone] 11/21/2012, 9:26 AM

## 2013-06-06 HISTORY — PX: OTHER SURGICAL HISTORY: SHX169

## 2013-09-20 ENCOUNTER — Other Ambulatory Visit (HOSPITAL_COMMUNITY): Payer: Self-pay | Admitting: Orthopaedic Surgery

## 2013-09-20 NOTE — Progress Notes (Signed)
Surgery scheduled on 09/29/13.  Preop on 09/26/13 at 1030am.  Need orders in EPIC.  Thank You.

## 2013-09-25 ENCOUNTER — Encounter (HOSPITAL_COMMUNITY): Payer: Self-pay | Admitting: Pharmacy Technician

## 2013-09-25 ENCOUNTER — Other Ambulatory Visit (HOSPITAL_COMMUNITY): Payer: Self-pay | Admitting: Orthopaedic Surgery

## 2013-09-25 NOTE — Patient Instructions (Addendum)
20 [name]  09/25/2013   Your procedure is scheduled JY:NWGNFAon:Friday March 6th  [facility] 530 AM.  Call this number if you have problems the morning of surgery [phone]   Remember: call dr Magnus Ivanblackman about stopping aspirin  Do not eat food or drink liquids :After Midnight.     Take these medicines the morning of surgery with A SIP OF WATER: bystolic, lipitor                                SEE [facility] PREPARING FOR SURGERY SHEET             You may not have any metal on your body including hair pins and piercings  Do not wear jewelry, make-up.  Do not wear lotions, powders, or perfumes. No  Deodorant is to be worn.   Men may shave face and neck.  Do not bring valuables to the hospital. [facility] IS NOT RESPONSIBLE FOR VALUEABLES.  Contacts, dentures or bridgework may not be worn into surgery.  Leave suitcase in the car. After surgery it may be brought to your room.  For patients admitted to the hospital, checkout time is 11:00 AM the day of discharge.    Please read over the following fact sheets that you were given: [facility] Preparing for [facility] sheet,  incentive spirometer sheet, blood fact sheet, MRSA information  Call Cain SieveSharon Keelyn Fjelstad RN pre op nurse if needed [phone]    FAILURE TO FOLLOW THESE INSTRUCTIONS MAY RESULT IN THE CANCELLATION OF YOUR SURGERY.  PATIENT SIGNATURE___________________________________________  NURSE SIGNATURE_____________________________________________

## 2013-09-25 NOTE — Progress Notes (Signed)
Chest xray 2 view  11-16-12 epic ekg 11-16-12 epic

## 2013-09-26 ENCOUNTER — Encounter (HOSPITAL_COMMUNITY): Payer: Self-pay

## 2013-09-26 ENCOUNTER — Encounter (HOSPITAL_COMMUNITY)
Admission: RE | Admit: 2013-09-26 | Discharge: 2013-09-26 | Disposition: A | Discharge status: Home / Self Care | Payer: Medicare Other | Source: Ambulatory Visit | Attending: Orthopaedic Surgery | Admitting: Orthopaedic Surgery

## 2013-09-26 DIAGNOSIS — Z01812 Encounter for preprocedural laboratory examination: Secondary | ICD-10-CM | POA: Insufficient documentation

## 2013-09-26 HISTORY — DX: Gout, unspecified: M10.9

## 2013-09-26 LAB — CBC
HCT: 35.7 % — ABNORMAL LOW (ref 36.0–46.0)
HEMOGLOBIN: 11.8 g/dL — AB (ref 12.0–15.0)
MCH: 29.6 pg (ref 26.0–34.0)
MCHC: 33.1 g/dL (ref 30.0–36.0)
MCV: 89.7 fL (ref 78.0–100.0)
Platelets: 309 10*3/uL (ref 150–400)
RBC: 3.98 MIL/uL (ref 3.87–5.11)
RDW: 13.9 % (ref 11.5–15.5)
WBC: 8.4 10*3/uL (ref 4.0–10.5)

## 2013-09-26 LAB — URINALYSIS, ROUTINE W REFLEX MICROSCOPIC
Bilirubin Urine: NEGATIVE
GLUCOSE, UA: NEGATIVE mg/dL
Hgb urine dipstick: NEGATIVE
Ketones, ur: NEGATIVE mg/dL
LEUKOCYTES UA: NEGATIVE
NITRITE: NEGATIVE
PH: 6 (ref 5.0–8.0)
Protein, ur: NEGATIVE mg/dL
SPECIFIC GRAVITY, URINE: 1.021 (ref 1.005–1.030)
Urobilinogen, UA: 0.2 mg/dL (ref 0.0–1.0)

## 2013-09-26 LAB — BASIC METABOLIC PANEL
BUN: 22 mg/dL (ref 6–23)
CALCIUM: 10 mg/dL (ref 8.4–10.5)
CO2: 25 meq/L (ref 19–32)
Chloride: 104 mEq/L (ref 96–112)
Creatinine, Ser: 1 mg/dL (ref 0.50–1.10)
GFR calc Af Amer: 63 mL/min — ABNORMAL LOW (ref >90)
GFR calc non Af Amer: 54 mL/min — ABNORMAL LOW (ref >90)
Glucose, Bld: 198 mg/dL — ABNORMAL HIGH (ref 70–99)
POTASSIUM: 4 meq/L (ref 3.7–5.3)
SODIUM: 143 meq/L (ref 137–147)

## 2013-09-26 LAB — SURGICAL PCR SCREEN
MRSA, PCR: NEGATIVE
Staphylococcus aureus: NEGATIVE

## 2013-09-26 LAB — APTT: APTT: 27 s (ref 24–37)

## 2013-09-26 LAB — PROTIME-INR
INR: 1.24 (ref 0.00–1.49)
PROTHROMBIN TIME: 15.3 s — AB (ref 11.6–15.2)

## 2013-09-26 NOTE — Progress Notes (Signed)
Patient instructed to call dr Cristal Deerchristopher blackman about stopping aspirin prior to surgery.

## 2013-09-26 NOTE — Progress Notes (Signed)
09/26/13 1031  OBSTRUCTIVE SLEEP APNEA  Have you ever been diagnosed with sleep apnea through a sleep study? No  Do you snore loudly (loud enough to be heard through closed doors)?  1  Do you often feel tired, fatigued, or sleepy during the daytime? 0  Has anyone observed you stop breathing during your sleep? 0  Do you have, or are you being treated for high blood pressure? 1  BMI more than 35 kg/m2? 1  Age over 75 years old? 1  Neck circumference greater than 40 cm/18 inches? 0  Gender: 0  Obstructive Sleep Apnea Score 4  Score 4 or greater  Results sent to PCP

## 2013-09-28 NOTE — Anesthesia Preprocedure Evaluation (Addendum)
Anesthesia Evaluation  Patient identified by MRN, date of birth, ID band Patient awake    Reviewed: Allergy & Precautions, H&P , NPO status , Patient's Chart, lab work & pertinent test results  Airway Mallampati: II TM Distance: <3 FB Neck ROM: Full    Dental no notable dental hx. (+) Teeth Intact, Dental Advisory Given   Pulmonary neg pulmonary ROS,  breath sounds clear to auscultation  Pulmonary exam normal       Cardiovascular hypertension, Pt. on medications and Pt. on home beta blockers Rhythm:Regular Rate:Normal     Neuro/Psych negative neurological ROS  negative psych ROS   GI/Hepatic negative GI ROS, Neg liver ROS,   Endo/Other  diabetes, Type 2, Oral Hypoglycemic Agents  Renal/GU negative Renal ROS  negative genitourinary   Musculoskeletal negative musculoskeletal ROS (+)   Abdominal   Peds  Hematology negative hematology ROS (+)   Anesthesia Other Findings   Reproductive/Obstetrics                         Anesthesia Physical Anesthesia Plan  ASA: II  Anesthesia Plan: Spinal   Post-op Pain Management:    Induction: Intravenous  Airway Management Planned: Simple Face Mask  Additional Equipment:   Intra-op Plan:   Post-operative Plan:   Informed Consent: I have reviewed the patients History and Physical, chart, labs and discussed the procedure including the risks, benefits and alternatives for the proposed anesthesia with the patient or authorized representative who has indicated his/her understanding and acceptance.   Dental advisory given  Plan Discussed with: CRNA  Anesthesia Plan Comments:       Anesthesia Quick Evaluation

## 2013-09-29 ENCOUNTER — Encounter (HOSPITAL_COMMUNITY)
Admission: RE | Disposition: A | Discharge status: Skilled Nursing Facility | Payer: Self-pay | Source: Ambulatory Visit | Attending: Orthopaedic Surgery

## 2013-09-29 ENCOUNTER — Inpatient Hospital Stay (HOSPITAL_COMMUNITY)
Admission: RE | Admit: 2013-09-29 | Discharge: 2013-10-02 | DRG: 470 | Disposition: A | Discharge status: Skilled Nursing Facility | Payer: Medicare Other | Source: Ambulatory Visit | Attending: Orthopaedic Surgery | Admitting: Orthopaedic Surgery

## 2013-09-29 ENCOUNTER — Encounter (HOSPITAL_COMMUNITY): Payer: Self-pay | Admitting: *Deleted

## 2013-09-29 ENCOUNTER — Encounter (HOSPITAL_COMMUNITY): Payer: Medicare Other | Admitting: Anesthesiology

## 2013-09-29 ENCOUNTER — Inpatient Hospital Stay (HOSPITAL_COMMUNITY): Payer: Medicare Other

## 2013-09-29 ENCOUNTER — Inpatient Hospital Stay (HOSPITAL_COMMUNITY): Payer: Medicare Other | Admitting: Anesthesiology

## 2013-09-29 DIAGNOSIS — I10 Essential (primary) hypertension: Secondary | ICD-10-CM | POA: Diagnosis present

## 2013-09-29 DIAGNOSIS — Z96649 Presence of unspecified artificial hip joint: Secondary | ICD-10-CM

## 2013-09-29 DIAGNOSIS — D62 Acute posthemorrhagic anemia: Secondary | ICD-10-CM | POA: Diagnosis not present

## 2013-09-29 DIAGNOSIS — Z79899 Other long term (current) drug therapy: Secondary | ICD-10-CM

## 2013-09-29 DIAGNOSIS — M1611 Unilateral primary osteoarthritis, right hip: Secondary | ICD-10-CM

## 2013-09-29 DIAGNOSIS — Z880 Allergy status to penicillin: Secondary | ICD-10-CM

## 2013-09-29 DIAGNOSIS — M109 Gout, unspecified: Secondary | ICD-10-CM | POA: Diagnosis present

## 2013-09-29 DIAGNOSIS — E119 Type 2 diabetes mellitus without complications: Secondary | ICD-10-CM | POA: Diagnosis present

## 2013-09-29 DIAGNOSIS — Z7982 Long term (current) use of aspirin: Secondary | ICD-10-CM

## 2013-09-29 DIAGNOSIS — M169 Osteoarthritis of hip, unspecified: Principal | ICD-10-CM | POA: Diagnosis present

## 2013-09-29 DIAGNOSIS — Z01812 Encounter for preprocedural laboratory examination: Secondary | ICD-10-CM

## 2013-09-29 DIAGNOSIS — J8410 Pulmonary fibrosis, unspecified: Secondary | ICD-10-CM | POA: Diagnosis present

## 2013-09-29 DIAGNOSIS — M1610 Unilateral primary osteoarthritis, unspecified hip: Principal | ICD-10-CM | POA: Diagnosis present

## 2013-09-29 HISTORY — PX: TOTAL HIP ARTHROPLASTY: SHX124

## 2013-09-29 LAB — TYPE AND SCREEN
ABO/RH(D): B POS
Antibody Screen: NEGATIVE

## 2013-09-29 LAB — GLUCOSE, CAPILLARY
GLUCOSE-CAPILLARY: 208 mg/dL — AB (ref 70–99)
Glucose-Capillary: 172 mg/dL — ABNORMAL HIGH (ref 70–99)

## 2013-09-29 SURGERY — ARTHROPLASTY, HIP, TOTAL, ANTERIOR APPROACH
Anesthesia: Spinal | Site: Hip | Laterality: Right

## 2013-09-29 MED ORDER — DEXAMETHASONE SODIUM PHOSPHATE 10 MG/ML IJ SOLN
INTRAMUSCULAR | Status: DC | PRN
  Administered 2013-09-29: 5 mg via INTRAVENOUS

## 2013-09-29 MED ORDER — ACETAMINOPHEN 650 MG RE SUPP: 650.0000 mg | Freq: Four times a day (QID) | RECTAL | Status: DC | PRN

## 2013-09-29 MED ORDER — METHOCARBAMOL 500 MG PO TABS
500.0000 mg | ORAL_TABLET | Freq: Four times a day (QID) | ORAL | Status: DC | PRN
  Administered 2013-10-01: 500 mg via ORAL
  Filled 2013-09-29: qty 1

## 2013-09-29 MED ORDER — LOSARTAN POTASSIUM 50 MG PO TABS
100.0000 mg | ORAL_TABLET | Freq: Every day | ORAL | Status: DC
  Administered 2013-09-29 – 2013-10-02 (×4): 100 mg via ORAL
  Filled 2013-09-29 (×5): qty 2

## 2013-09-29 MED ORDER — PROPOFOL 10 MG/ML IV BOLUS
INTRAVENOUS | Status: AC
  Filled 2013-09-29: qty 20

## 2013-09-29 MED ORDER — PROPOFOL 10 MG/ML IV BOLUS
INTRAVENOUS | Status: DC | PRN
  Administered 2013-09-29: 10 mg via INTRAVENOUS
  Administered 2013-09-29: 20 mg via INTRAVENOUS

## 2013-09-29 MED ORDER — PROPOFOL INFUSION 10 MG/ML OPTIME
INTRAVENOUS | Status: DC | PRN
  Administered 2013-09-29: 50 ug/kg/min via INTRAVENOUS

## 2013-09-29 MED ORDER — EPHEDRINE SULFATE 50 MG/ML IJ SOLN
INTRAMUSCULAR | Status: AC
  Filled 2013-09-29: qty 1

## 2013-09-29 MED ORDER — EPHEDRINE SULFATE 50 MG/ML IJ SOLN
INTRAMUSCULAR | Status: DC | PRN
  Administered 2013-09-29 (×2): 10 mg via INTRAVENOUS

## 2013-09-29 MED ORDER — ATORVASTATIN CALCIUM 40 MG PO TABS
40.0000 mg | ORAL_TABLET | Freq: Every morning | ORAL | Status: DC
  Administered 2013-09-30 – 2013-10-02 (×3): 40 mg via ORAL
  Filled 2013-09-29 (×3): qty 1

## 2013-09-29 MED ORDER — LACTATED RINGERS IV SOLN
INTRAVENOUS | Status: DC | PRN
  Administered 2013-09-29 (×2): via INTRAVENOUS

## 2013-09-29 MED ORDER — FENTANYL CITRATE 0.05 MG/ML IJ SOLN
INTRAMUSCULAR | Status: AC
  Filled 2013-09-29: qty 2

## 2013-09-29 MED ORDER — DIPHENHYDRAMINE HCL 12.5 MG/5ML PO ELIX: 12.5000 mg | ORAL_SOLUTION | ORAL | Status: DC | PRN

## 2013-09-29 MED ORDER — METOCLOPRAMIDE HCL 10 MG PO TABS: 5.0000 mg | ORAL_TABLET | Freq: Three times a day (TID) | ORAL | Status: DC | PRN

## 2013-09-29 MED ORDER — LOSARTAN POTASSIUM-HCTZ 100-25 MG PO TABS: 1.0000 | ORAL_TABLET | Freq: Every morning | ORAL | Status: DC

## 2013-09-29 MED ORDER — ONDANSETRON HCL 4 MG/2ML IJ SOLN
4.0000 mg | Freq: Four times a day (QID) | INTRAMUSCULAR | Status: DC | PRN
  Administered 2013-09-29: 4 mg via INTRAVENOUS
  Filled 2013-09-29: qty 2

## 2013-09-29 MED ORDER — GLIMEPIRIDE 4 MG PO TABS
4.0000 mg | ORAL_TABLET | Freq: Every day | ORAL | Status: DC
  Administered 2013-09-30 – 2013-10-02 (×3): 4 mg via ORAL
  Filled 2013-09-29 (×4): qty 1

## 2013-09-29 MED ORDER — LACTATED RINGERS IV SOLN: INTRAVENOUS | Status: DC

## 2013-09-29 MED ORDER — PHENOL 1.4 % MT LIQD
1.0000 | OROMUCOSAL | Status: DC | PRN
  Filled 2013-09-29: qty 177

## 2013-09-29 MED ORDER — 0.9 % SODIUM CHLORIDE (POUR BTL) OPTIME
TOPICAL | Status: DC | PRN
  Administered 2013-09-29: 1000 mL

## 2013-09-29 MED ORDER — HYDROMORPHONE HCL PF 1 MG/ML IJ SOLN
0.2500 mg | INTRAMUSCULAR | Status: DC | PRN
  Administered 2013-09-29 (×2): 0.5 mg via INTRAVENOUS

## 2013-09-29 MED ORDER — HYDROMORPHONE HCL PF 1 MG/ML IJ SOLN: 1.0000 mg | INTRAMUSCULAR | Status: DC | PRN

## 2013-09-29 MED ORDER — OXYCODONE HCL ER 10 MG PO T12A
10.0000 mg | EXTENDED_RELEASE_TABLET | Freq: Two times a day (BID) | ORAL | Status: DC
  Administered 2013-09-29 – 2013-10-02 (×7): 10 mg via ORAL
  Filled 2013-09-29 (×7): qty 1

## 2013-09-29 MED ORDER — ONDANSETRON HCL 4 MG/2ML IJ SOLN
INTRAMUSCULAR | Status: DC | PRN
  Administered 2013-09-29: 4 mg via INTRAVENOUS

## 2013-09-29 MED ORDER — ALISKIREN FUMARATE 150 MG PO TABS
150.0000 mg | ORAL_TABLET | Freq: Every morning | ORAL | Status: DC
  Administered 2013-09-29 – 2013-10-02 (×4): 150 mg via ORAL
  Filled 2013-09-29 (×4): qty 1

## 2013-09-29 MED ORDER — HYDRALAZINE HCL 25 MG PO TABS
25.0000 mg | ORAL_TABLET | Freq: Every morning | ORAL | Status: DC
Start: 2013-09-29 — End: 2013-10-02
  Administered 2013-09-29 – 2013-10-02 (×4): 25 mg via ORAL
  Filled 2013-09-29 (×5): qty 1

## 2013-09-29 MED ORDER — CLINDAMYCIN PHOSPHATE 900 MG/50ML IV SOLN
900.0000 mg | INTRAVENOUS | Status: AC
  Administered 2013-09-29: 900 mg via INTRAVENOUS

## 2013-09-29 MED ORDER — SODIUM CHLORIDE 0.9 % IV SOLN
INTRAVENOUS | Status: DC
  Administered 2013-09-29 – 2013-09-30 (×2): via INTRAVENOUS

## 2013-09-29 MED ORDER — ALUM & MAG HYDROXIDE-SIMETH 200-200-20 MG/5ML PO SUSP
30.0000 mL | ORAL | Status: DC | PRN
Start: 2013-09-29 — End: 2013-10-02

## 2013-09-29 MED ORDER — MENTHOL 3 MG MT LOZG
1.0000 | LOZENGE | OROMUCOSAL | Status: DC | PRN
  Filled 2013-09-29: qty 9

## 2013-09-29 MED ORDER — ACETAMINOPHEN 325 MG PO TABS
650.0000 mg | ORAL_TABLET | Freq: Four times a day (QID) | ORAL | Status: DC | PRN
Start: 2013-09-29 — End: 2013-10-02

## 2013-09-29 MED ORDER — FENTANYL CITRATE 0.05 MG/ML IJ SOLN
INTRAMUSCULAR | Status: DC | PRN
  Administered 2013-09-29 (×2): 50 ug via INTRAVENOUS

## 2013-09-29 MED ORDER — CLINDAMYCIN PHOSPHATE 600 MG/50ML IV SOLN
600.0000 mg | Freq: Four times a day (QID) | INTRAVENOUS | Status: AC
  Administered 2013-09-29 (×2): 600 mg via INTRAVENOUS
  Filled 2013-09-29 (×2): qty 50

## 2013-09-29 MED ORDER — CLINDAMYCIN PHOSPHATE 900 MG/50ML IV SOLN
INTRAVENOUS | Status: AC
  Filled 2013-09-29: qty 50

## 2013-09-29 MED ORDER — HYDROMORPHONE HCL PF 1 MG/ML IJ SOLN
INTRAMUSCULAR | Status: AC
  Filled 2013-09-29: qty 1

## 2013-09-29 MED ORDER — MIDAZOLAM HCL 5 MG/5ML IJ SOLN
INTRAMUSCULAR | Status: DC | PRN
  Administered 2013-09-29 (×2): 1 mg via INTRAVENOUS

## 2013-09-29 MED ORDER — HYDROCHLOROTHIAZIDE 25 MG PO TABS
25.0000 mg | ORAL_TABLET | Freq: Every day | ORAL | Status: DC
  Administered 2013-09-29 – 2013-10-01 (×3): 25 mg via ORAL
  Filled 2013-09-29 (×5): qty 1

## 2013-09-29 MED ORDER — SODIUM CHLORIDE 0.9 % IV SOLN
INTRAVENOUS | Status: DC | PRN
  Administered 2013-09-29: 1000 mL

## 2013-09-29 MED ORDER — PROMETHAZINE HCL 25 MG/ML IJ SOLN: 6.2500 mg | INTRAMUSCULAR | Status: DC | PRN

## 2013-09-29 MED ORDER — ONDANSETRON HCL 4 MG/2ML IJ SOLN
INTRAMUSCULAR | Status: AC
  Filled 2013-09-29: qty 2

## 2013-09-29 MED ORDER — DEXTROSE 5 % IV SOLN
500.0000 mg | Freq: Four times a day (QID) | INTRAVENOUS | Status: DC | PRN
  Administered 2013-09-29: 500 mg via INTRAVENOUS
  Filled 2013-09-29: qty 5

## 2013-09-29 MED ORDER — ALLOPURINOL 100 MG PO TABS
100.0000 mg | ORAL_TABLET | Freq: Every morning | ORAL | Status: DC
  Administered 2013-09-29 – 2013-10-02 (×4): 100 mg via ORAL
  Filled 2013-09-29 (×4): qty 1

## 2013-09-29 MED ORDER — NEBIVOLOL HCL 10 MG PO TABS
10.0000 mg | ORAL_TABLET | Freq: Every morning | ORAL | Status: DC
  Administered 2013-09-30 – 2013-10-02 (×3): 10 mg via ORAL
  Filled 2013-09-29 (×4): qty 1

## 2013-09-29 MED ORDER — ASPIRIN EC 325 MG PO TBEC
325.0000 mg | DELAYED_RELEASE_TABLET | Freq: Two times a day (BID) | ORAL | Status: DC
  Administered 2013-09-30 – 2013-10-02 (×5): 325 mg via ORAL
  Filled 2013-09-29 (×7): qty 1

## 2013-09-29 MED ORDER — SODIUM CHLORIDE 0.9 % IV SOLN
1000.0000 mg | INTRAVENOUS | Status: AC
  Administered 2013-09-29: 1000 mg via INTRAVENOUS
  Filled 2013-09-29: qty 10

## 2013-09-29 MED ORDER — HYDROMORPHONE HCL PF 1 MG/ML IJ SOLN: 0.2500 mg | INTRAMUSCULAR | Status: DC | PRN

## 2013-09-29 MED ORDER — DOCUSATE SODIUM 100 MG PO CAPS
100.0000 mg | ORAL_CAPSULE | Freq: Two times a day (BID) | ORAL | Status: DC
  Administered 2013-09-29 – 2013-10-02 (×6): 100 mg via ORAL

## 2013-09-29 MED ORDER — STERILE WATER FOR IRRIGATION IR SOLN
Status: DC | PRN
  Administered 2013-09-29: 1500 mL

## 2013-09-29 MED ORDER — ONDANSETRON HCL 4 MG PO TABS: 4.0000 mg | ORAL_TABLET | Freq: Four times a day (QID) | ORAL | Status: DC | PRN

## 2013-09-29 MED ORDER — SODIUM CHLORIDE 0.9 % IJ SOLN
INTRAMUSCULAR | Status: AC
  Filled 2013-09-29: qty 10

## 2013-09-29 MED ORDER — OXYCODONE HCL 5 MG PO TABS
5.0000 mg | ORAL_TABLET | ORAL | Status: DC | PRN
  Administered 2013-09-29 – 2013-09-30 (×3): 5 mg via ORAL
  Administered 2013-09-30 – 2013-10-02 (×2): 10 mg via ORAL
  Filled 2013-09-29: qty 1
  Filled 2013-09-29: qty 2
  Filled 2013-09-29 (×2): qty 1
  Filled 2013-09-29: qty 2

## 2013-09-29 MED ORDER — MIDAZOLAM HCL 2 MG/2ML IJ SOLN
INTRAMUSCULAR | Status: AC
  Filled 2013-09-29: qty 2

## 2013-09-29 MED ORDER — POLYETHYLENE GLYCOL 3350 17 G PO PACK: 17.0000 g | PACK | Freq: Every day | ORAL | Status: DC | PRN

## 2013-09-29 MED ORDER — METOCLOPRAMIDE HCL 5 MG/ML IJ SOLN: 5.0000 mg | Freq: Three times a day (TID) | INTRAMUSCULAR | Status: DC | PRN

## 2013-09-29 SURGICAL SUPPLY — 40 items
BAG ZIPLOCK 12X15 (MISCELLANEOUS) IMPLANT
BENZOIN TINCTURE PRP APPL 2/3 (GAUZE/BANDAGES/DRESSINGS) ×3 IMPLANT
BLADE SAW SGTL 18X1.27X75 (BLADE) ×2 IMPLANT
BLADE SAW SGTL 18X1.27X75MM (BLADE) ×1
CAPT HIP PF MOP ×3 IMPLANT
CELLS DAT CNTRL 66122 CELL SVR (MISCELLANEOUS) ×1 IMPLANT
CLOSURE WOUND 1/2 X4 (GAUZE/BANDAGES/DRESSINGS) ×1
DRAPE C-ARM 42X120 X-RAY (DRAPES) ×3 IMPLANT
DRAPE STERI IOBAN 125X83 (DRAPES) ×3 IMPLANT
DRAPE U-SHAPE 47X51 STRL (DRAPES) ×9 IMPLANT
DRSG AQUACEL AG ADV 3.5X10 (GAUZE/BANDAGES/DRESSINGS) ×3 IMPLANT
DURAPREP 26ML APPLICATOR (WOUND CARE) ×3 IMPLANT
ELECT BLADE TIP CTD 4 INCH (ELECTRODE) ×3 IMPLANT
ELECT REM PT RETURN 9FT ADLT (ELECTROSURGICAL) ×3
ELECTRODE REM PT RTRN 9FT ADLT (ELECTROSURGICAL) ×1 IMPLANT
FACESHIELD LNG OPTICON STERILE (SAFETY) ×12 IMPLANT
GAUZE XEROFORM 5X9 LF (GAUZE/BANDAGES/DRESSINGS) IMPLANT
GLOVE BIO SURGEON STRL SZ7.5 (GLOVE) ×3 IMPLANT
GLOVE BIOGEL PI IND STRL 8 (GLOVE) ×2 IMPLANT
GLOVE BIOGEL PI INDICATOR 8 (GLOVE) ×4
GLOVE ECLIPSE 8.0 STRL XLNG CF (GLOVE) ×3 IMPLANT
GOWN STRL REUS W/TWL XL LVL3 (GOWN DISPOSABLE) ×6 IMPLANT
HANDPIECE INTERPULSE COAX TIP (DISPOSABLE) ×2
KIT BASIN OR (CUSTOM PROCEDURE TRAY) ×3 IMPLANT
PACK TOTAL JOINT (CUSTOM PROCEDURE TRAY) ×3 IMPLANT
PADDING CAST COTTON 6X4 STRL (CAST SUPPLIES) ×3 IMPLANT
RTRCTR WOUND ALEXIS 18CM MED (MISCELLANEOUS) ×3
SET HNDPC FAN SPRY TIP SCT (DISPOSABLE) ×1 IMPLANT
STAPLER VISISTAT 35W (STAPLE) IMPLANT
STRIP CLOSURE SKIN 1/2X4 (GAUZE/BANDAGES/DRESSINGS) ×2 IMPLANT
SUT ETHIBOND NAB CT1 #1 30IN (SUTURE) ×3 IMPLANT
SUT ETHILON 3 0 PS 1 (SUTURE) IMPLANT
SUT MNCRL AB 4-0 PS2 18 (SUTURE) ×3 IMPLANT
SUT VIC AB 0 CT1 36 (SUTURE) ×3 IMPLANT
SUT VIC AB 1 CT1 36 (SUTURE) ×3 IMPLANT
SUT VIC AB 2-0 CT1 27 (SUTURE) ×2
SUT VIC AB 2-0 CT1 TAPERPNT 27 (SUTURE) ×1 IMPLANT
TOWEL OR 17X26 10 PK STRL BLUE (TOWEL DISPOSABLE) ×3 IMPLANT
TOWEL OR NON WOVEN STRL DISP B (DISPOSABLE) ×3 IMPLANT
TRAY FOLEY CATH 14FRSI W/METER (CATHETERS) IMPLANT

## 2013-09-29 NOTE — Anesthesia Procedure Notes (Signed)
Spinal  Patient location during procedure: OR Start time: 09/29/2013 7:30 AM End time: 09/29/2013 7:35 AM Staffing CRNA/Resident: Lajuana Carry E Performed by: resident/CRNA  Preanesthetic Checklist Completed: patient identified, site marked, surgical consent, pre-op evaluation, timeout performed, IV checked, risks and benefits discussed and monitors and equipment checked Spinal Block Patient position: sitting Prep: Betadine Patient monitoring: heart rate, continuous pulse ox and blood pressure Approach: midline Location: L3-4 Injection technique: single-shot Needle Needle type: Spinocan  Needle gauge: 22 G Needle length: 9 cm Assessment Sensory level: T8 Additional Notes Kit expiration 09/2014. Sitting position, time out, sedated at charted. Sterile prep and drape. Positive, clear CSF w/ first attempt. Bupivicaine as charted. Clear CSF pre/post injection. Tolerated well, return to supine.

## 2013-09-29 NOTE — Progress Notes (Signed)
Portable AP Pelvis and Lateral Right Hip X-rays done. 

## 2013-09-29 NOTE — Preoperative (Addendum)
Beta Blockers   Reason not to administer Beta Blockers:Not Applicable, took this AM 

## 2013-09-29 NOTE — Plan of Care (Signed)
Problem: Consults Goal: Diagnosis- Total Joint Replacement Right anterior total hip     

## 2013-09-29 NOTE — Brief Op Note (Signed)
09/29/2013  8:57 AM  PATIENT:  [name]  73 y.o. female  PRE-OPERATIVE DIAGNOSIS:  Right hip osteoarthritis  POST-OPERATIVE DIAGNOSIS:  Right hip osteoarthritis  PROCEDURE:  Procedure(s): RIGHT TOTAL HIP ARTHROPLASTY ANTERIOR APPROACH (Right)  SURGEON:  Surgeon(s) and Role:    * Kathryne Hitchhristopher Y Blanca Thornton, MD - Primary  PHYSICIAN ASSISTANT: Rexene EdisonGil Clark, PA-C  ANESTHESIA:   spinal  EBL:  Total I/O In: 1000 [I.V.:1000] Out: -   BLOOD ADMINISTERED:none  DRAINS: none   LOCAL MEDICATIONS USED:  NONE  SPECIMEN:  No Specimen  DISPOSITION OF SPECIMEN:  N/A  COUNTS:  YES  TOURNIQUET:  * No tourniquets in log *  DICTATION: .Other Dictation: Dictation Number [id number]  PLAN OF CARE: Admit to inpatient   PATIENT DISPOSITION:  PACU - hemodynamically stable.   Delay start of Pharmacological VTE agent (>24hrs) due to surgical blood loss or risk of bleeding: no

## 2013-09-29 NOTE — H&P (Signed)
TOTAL HIP ADMISSION H&P  Patient is admitted for right total hip arthroplasty.  Subjective:  Chief Complaint: right hip pain  HPI: [name], 73 y.o. female, has a history of pain and functional disability in the right hip(s) due to arthritis and patient has failed non-surgical conservative treatments for greater than 12 weeks to include NSAID's and/or analgesics, corticosteriod injections, flexibility and strengthening excercises, supervised PT with diminished ADL's post treatment, use of assistive devices, weight reduction as appropriate and activity modification.  Onset of symptoms was gradual starting 2 years ago with gradually worsening course since that time.The patient noted no past surgery on the right hip(s).  Patient currently rates pain in the right hip at 8 out of 10 with activity. Patient has night pain, worsening of pain with activity and weight bearing, trendelenberg gait, pain that interfers with activities of daily living and pain with passive range of motion. Patient has evidence of subchondral sclerosis, periarticular osteophytes and joint space narrowing by imaging studies. This condition presents safety issues increasing the risk of falls.  There is no current active infection.  Patient Active Problem List   Diagnosis Date Noted  . Arthritis of right hip 09/29/2013  . Degenerative arthritis of hip 11/18/2012   Past Medical History  Diagnosis Date  . Hypertension   . Lung fibrosis   . Diabetes mellitus without complication   . Headache(784.0)     occasional headache   . Arthritis   . Gout     no recent flares    Past Surgical History  Procedure Laterality Date  . Total hip arthroplasty Left 11/18/2012    Procedure: LEFT TOTAL HIP ARTHROPLASTY ANTERIOR APPROACH;  Surgeon: Kathryne Hitch, MD;  Location: [facility];  Service: Orthopedics;  Laterality: Left;  . Back surgery   10 yrs ago    lumbar fusion   . Joint replacement  10 yrs ago    right knee  replacement   . Buinion removed Left nov 11 , 2014    Prescriptions prior to admission  Medication Sig Dispense Refill  . aliskiren (TEKTURNA) 150 MG tablet Take 150 mg by mouth every morning.       Marland Kitchen allopurinol (ZYLOPRIM) 100 MG tablet Take 100 mg by mouth every morning.      Marland Kitchen atorvastatin (LIPITOR) 40 MG tablet Take 40 mg by mouth every morning.      Marland Kitchen glimepiride (AMARYL) 4 MG tablet Take 4 mg by mouth daily before breakfast.      . guaiFENesin-codeine (ROBITUSSIN AC) 100-10 MG/5ML syrup Take 5 mLs by mouth 3 (three) times daily as needed for cough.      . hydrALAZINE (APRESOLINE) 25 MG tablet Take 25 mg by mouth every morning.      Marland Kitchen losartan-hydrochlorothiazide (HYZAAR) 100-25 MG per tablet Take 1 tablet by mouth every morning.      . nebivolol (BYSTOLIC) 10 MG tablet Take 10 mg by mouth every morning.      Marland Kitchen oxyCODONE-acetaminophen (PERCOCET) 10-325 MG per tablet Take 1 tablet by mouth 2 (two) times daily as needed for pain.      . saxagliptin HCl (ONGLYZA) 5 MG TABS tablet Take 5 mg by mouth every morning.      Marland Kitchen aspirin EC 325 MG tablet Take 325 mg by mouth every morning.      . Aspirin-Acetaminophen-Caffeine (GOODY HEADACHE PO) Take 1 packet by mouth as needed (pain).      . Vitamin D, Ergocalciferol, (DRISDOL) 50000 UNITS CAPS Take  50,000 Units by mouth 2 (two) times a week. Tuesday and Friday       Allergies  Allergen Reactions  . Penicillins Hives and Rash    History  Substance Use Topics  . Smoking status: Never Smoker   . Smokeless tobacco: Never Used  . Alcohol Use: No    History reviewed. No pertinent family history.   Review of Systems  Musculoskeletal: Positive for joint pain.  All other systems reviewed and are negative.    Objective:  Physical Exam  Constitutional: She is oriented to person, place, and time. She appears well-developed and well-nourished.  HENT:  Head: Normocephalic and atraumatic.  Eyes: EOM are normal. Pupils are equal, round, and  reactive to light.  Neck: Normal range of motion. Neck supple.  Cardiovascular: Normal rate and regular rhythm.   Respiratory: Effort normal and breath sounds normal.  GI: Soft. Bowel sounds are normal.  Musculoskeletal:       Right hip: She exhibits decreased range of motion, decreased strength and bony tenderness.  Neurological: She is alert and oriented to person, place, and time.  Skin: Skin is warm and dry.  Psychiatric: She has a normal mood and affect.    Vital signs in last 24 hours: Temp:  [97.6 F (36.4 C)] 97.6 F (36.4 C) (03/06 0529) Pulse Rate:  [85] 85 (03/06 0529) Resp:  [18] 18 (03/06 0529) SpO2:  [99 %] 99 % (03/06 0529)  Labs:   Estimated body mass index is 38.04 kg/(m^2) as calculated from the following:   Height as of 09/26/13: 5\' 5"  (1.651 m).   Weight as of 11/16/12: 103.692 kg (228 lb 9.6 oz).   Imaging Review Plain radiographs demonstrate severe degenerative joint disease of the right hip(s). The bone quality appears to be good for age and reported activity level.  Assessment/Plan:  End stage arthritis, right hip(s)  The patient history, physical examination, clinical judgement of the provider and imaging studies are consistent with end stage degenerative joint disease of the right hip(s) and total hip arthroplasty is deemed medically necessary. The treatment options including medical management, injection therapy, arthroscopy and arthroplasty were discussed at length. The risks and benefits of total hip arthroplasty were presented and reviewed. The risks due to aseptic loosening, infection, stiffness, dislocation/subluxation,  thromboembolic complications and other imponderables were discussed.  The patient acknowledged the explanation, agreed to proceed with the plan and consent was signed. Patient is being admitted for inpatient treatment for surgery, pain control, PT, OT, prophylactic antibiotics, VTE prophylaxis, progressive ambulation and ADL's and  discharge planning.The patient is planning to be discharged to skilled nursing facility

## 2013-09-29 NOTE — Evaluation (Signed)
Physical Therapy Evaluation Patient Details Name: [name] L Hinks MRN: [id number] DOB: [birth year] Today's Date: 09/29/2013 Time: [id number] PT Time Calculation (min): 35 min  PT Assessment / Plan / Recommendation History of Present Illness  R THR - ant dir  Clinical Impression  Pt s/p R THR presents with decreased R LE strength/ROM and post op pain limiting functional mobility.  Pt will benefit from follow up rehab at SNF level to maximize IND and safety prior to return home with ltd assist.    PT Assessment  Patient needs continued PT services    Follow Up Recommendations  SNF    Does the patient have the potential to tolerate intense rehabilitation      Barriers to Discharge        Equipment Recommendations  None recommended by PT    Recommendations for Other Services OT consult   Frequency 7X/week    Precautions / Restrictions Precautions Precautions: Fall Restrictions Weight Bearing Restrictions: No Other Position/Activity Restrictions: WBAT   Pertinent Vitals/Pain 6/10; premed, ice pack provided      Mobility  Bed Mobility Overal bed mobility: Needs Assistance Bed Mobility: Supine to Sit Supine to sit: Mod assist General bed mobility comments: cues for sequence and use of L LE to self assist Transfers Overall transfer level: Needs assistance Equipment used: Rolling walker (2 wheeled) Transfers: Sit to/from Stand Sit to Stand: Mod assist General transfer comment: cues for LE management and use of UEs to self assist Ambulation/Gait Ambulation/Gait assistance: Min assist;Mod assist Ambulation Distance (Feet): 28 Feet Assistive device: Rolling walker (2 wheeled) Gait Pattern/deviations: Step-to pattern;Decreased step length - left;Decreased step length - right;Shuffle;Trunk flexed Gait velocity: decr General Gait Details: Cues for sequence, stride length, posture and position from RW    Exercises Total Joint Exercises Ankle Circles/Pumps: AROM;Both;10  reps;Supine Quad Sets: AROM;Both;10 reps;Supine Heel Slides: AAROM;Right;15 reps;Supine Hip ABduction/ADduction: AAROM;Right;15 reps;Supine   PT Diagnosis: Difficulty walking  PT Problem List: Decreased strength;Decreased range of motion;Decreased activity tolerance;Decreased mobility;Decreased knowledge of use of DME;Obesity;Pain PT Treatment Interventions: DME instruction;Gait training;Functional mobility training;Therapeutic activities;Therapeutic exercise;Patient/family education     PT Goals(Current goals can be found in the care plan section) Acute Rehab PT Goals Patient Stated Goal: Resume previous lifestyle with decreased pain PT Goal Formulation: With patient Time For Goal Achievement: 10/06/13 Potential to Achieve Goals: Good  Visit Information  Last PT Received On: 09/29/13 Assistance Needed: +1 History of Present Illness: R THR - ant dir       Prior Functioning  Home Living Family/patient expects to be discharged to:: Skilled nursing facility Living Arrangements: Alone Home Equipment: Environmental consultantWalker - 2 wheels Prior Function Level of Independence: Independent with assistive device(s) Communication Communication: No difficulties Dominant Hand: Right    Cognition  Cognition Arousal/Alertness: Awake/alert Behavior During Therapy: WFL for tasks assessed/performed Overall Cognitive Status: Within Functional Limits for tasks assessed    Extremity/Trunk Assessment Upper Extremity Assessment Upper Extremity Assessment: Overall WFL for tasks assessed Lower Extremity Assessment Lower Extremity Assessment: RLE deficits/detail RLE Deficits / Details: 2/5 hip strength with AAROM at hip to 85 flex and 15 abd Cervical / Trunk Assessment Cervical / Trunk Assessment: Normal   Balance    End of Session PT - End of Session Equipment Utilized During Treatment: Gait belt Activity Tolerance: Patient tolerated treatment well Patient left: in chair;with call bell/phone within  reach Nurse Communication: Mobility status  GP     Kahdijah Errickson 09/29/2013, 3:30 PM

## 2013-09-29 NOTE — Progress Notes (Signed)
X-ray results noted 

## 2013-09-29 NOTE — Progress Notes (Signed)
Inpatient Diabetes Program Recommendations  AACE/ADA: New Consensus Statement on Inpatient Glycemic Control (2013)  Target Ranges:  Prepandial:   less than 140 mg/dL      Peak postprandial:   less than 180 mg/dL (1-2 hours)      Critically ill patients:  140 - 180 mg/dL   Results for [name], [name] (MRN [id number]) as of 09/29/2013 12:16  Ref. Range 09/29/2013 05:45 09/29/2013 09:57  Glucose-Capillary Latest Range: 70-99 mg/dL 440172 (H) 102208 (H)   Diabetes history: DM2 Outpatient Diabetes medications: Onglyza 5 mg QAM, Amaryl 4 mg QAM Current orders for Inpatient glycemic control: Amaryl 4 mg QAM; Noted order: If diabetic or glucose greater than 140mg /dl, see Gylcemic Control (SSI) Order Set but Glycemic control order set not currently ordered   Inpatient Diabetes Program Recommendations Correction (SSI): While inpatient, please order CBGs with Novolog correction scale ACHS. HgbA1C: Please conisder ordering an A1C to evaluate glycemic control over the past 2-3 months.  Thanks, Orlando PennerMarie Paislei Dorval, RN, MSN, CCRN Diabetes Coordinator Inpatient Diabetes Program [phone] (Team Pager) [phone] ([facility] office) [phone] [facility] office)

## 2013-09-29 NOTE — Transfer of Care (Signed)
Immediate Anesthesia Transfer of Care Note  Patient: [name]  Procedure(s) Performed: Procedure(s) (LRB): RIGHT TOTAL HIP ARTHROPLASTY ANTERIOR APPROACH (Right)  Patient Location: PACU  Anesthesia Type: Spinal  Level of Consciousness: sedated, patient cooperative and responds to stimulation  Airway & Oxygen Therapy: Patient Spontanous Breathing and Patient connected to face mask oxgen  Post-op Assessment: Report given to PACU RN and Post -op Vital signs reviewed and stable  Post vital signs: Reviewed and stable  Complications: No apparent anesthesia complications

## 2013-09-29 NOTE — Progress Notes (Signed)
Dr. Rica MastFortune made aware of patient's CBG results in PACU-208

## 2013-09-29 NOTE — Progress Notes (Signed)
Utilization review completed.  

## 2013-09-29 NOTE — Op Note (Signed)
[name], [name] NO.:  [id number]  MEDICAL RECORD NO.:  [id number]  LOCATION:  1621                         FACILITY:  [facility]  PHYSICIAN:  [name]. Magnus Salinas, M.D.DATE OF BIRTH:  1940-09-05  DATE OF PROCEDURE:  09/29/2013 DATE OF DISCHARGE:                              OPERATIVE REPORT   PREOPERATIVE DIAGNOSIS:  Severe end-stage arthritis and degenerative joint disease, right hip.  POSTOPERATIVE DIAGNOSIS:  Severe end-stage arthritis and degenerative joint disease, right hip.  PROCEDURE:  Right total hip arthroplasty through direct anterior approach.  IMPLANTS:  DePuy Sector Gription acetabular component size 50, with apex hole eliminator guide, size 32+ 4 neutral polyethylene liner, size 12 Corail femoral component with varus offset (KLA), size 32+1 metal hip ball.  SURGEON:  [name] MD.  ASSISTING:  Tamara Canal, PA-C  ANESTHESIA:  Spinal.  BLOOD LOSS:  250 mL.  ANTIBIOTICS:  900 mg IV clindamycin.  COMPLICATIONS:  None.  INDICATIONS:  [name] is a very pleasant 73 year old female with debilitating arthritis of both her hips.  Last year, she underwent a successful left total hip arthroplasty.  Her right hip has been bothering her quite a bit.  X-ray show severe end-stage arthritis of that hip.  Her pain is daily.  Her mobility is limited and her quality of life has been affected.  At this point, she wished to proceed with a total hip arthroplasty on the right side.  She understands fully the risks and benefits of the surgery.  The risks of acute blood loss anemia, nerve and vessel injury, fracture, infection, dislocation, DVT. She understands the goals are decreased pain, improved mobility with overall improved quality of life.  PROCEDURE IN DETAIL:  After informed consent was obtained and appropriate right hip was marked., she was brought to the operating room and spinal anesthesia was obtained while she was on  the stretcher. Foley catheter was placed and then traction boots were placed on both her feet.  She was next placed supine on the Hana fracture table with the perineal post in place and both legs in inline skeletal traction devices with no traction applied.  Her right operative hip was then prepped and draped with DuraPrep and sterile drapes.  A time-out was called.  We then made incision inferior and posterior to the anterosuperior iliac spine and carried this obliquely down the leg.  I dissected down to the tensor fascia lata and then the tensor fascia was then divided longitudinally, so I can proceed with a direct anterior approach to the hip.  A Cobra retractor was placed around the lateral neck and up underneath the rectus femoris and Cobra retractor was placed medially.  I cauterized the lateral femoral circumflex vessels and then opened up the hip capsule in L-type format placing the Cobra retractors within the hip capsule.  We found a femoral head devoid of cartilage and significant osteophytes around the femoral neck.  I released the labrum and used the oscillating saw to make my femoral neck cut just proximal to the lesser trochanter and then completed this with an osteotome.  I placed a corkscrew guide in the femoral head and removed the femoral head in its entirety.  We  then cleaned the acetabulum and debris including marginal osteophytes.  We placed a bent Hohmann medially and a Cobra retractor laterally.  I then began reaming from a size 42 reamer in 2 mm increments all the way up to size 50 with all reamers placed under direct visualization and the last reamer also under direct fluoroscopy so we could obtain our depth of reaming, our inclination, and anteversion.  Once I was pleased with this, I placed the real DePuy sector Gription acetabular component size 50, with apex hole eliminator guide, size 32 +4 neutral polyethylene liner.  Attention was then turned to the femur  with the leg externally rotated to 100 degrees extended and adducted.  I was able to place a medial retractor by the medial calcar region and a Hohmann retractor behind the greater trochanter.  I released the lateral joint capsule and then used a box cutting osteotome in the anterior femoral Salinas and a rongeur to lateralize.  I then began broaching using the Corail broaching system from a size 8 up to a size 12.  This mashed to other side.  We then placed a varus offset femoral neck trial and a 32+1 femoral head.  We brought the leg back up and over with traction, internal rotation reduced it in the pelvis.  I then externally rotated to 90-95 degrees and could not dislocate the hip. She had minimal shuck and I could rotate her significantly internally without instability.  Her leg lengths and offsets were also measured near equal under direct fluoroscopy.  I then dislocated the hip and removed the trial components and placed the real Corail femoral component size 12 with varus offset and the real 32+1 metal hip ball mashed her other side.  We then reduced the hip once more.  We copiously irrigated the soft tissue with normal saline solution and closed the joint capsule with interrupted #1 Ethibond suture followed by running #1 Vicryl in the tensor fascia, 0 Vicryl in the deep tissue, 2-0 Vicryl in subcutaneous tissue, 4-0 Monocryl subcuticular stitch and Steri-Strips on the skin.  An Aquacel dressing was applied.  She was taken off the Hana table to the recovery room in stable condition.  All final counts were correct and no complications noted.  Of note, Tamara CanalGilbert Clark, PA-C was present the entire case.  His presence was crucial for facilitating this case retracting and layered closure of the wound.     [name], M.D.     CYB/MEDQ  D:  09/29/2013  T:  09/29/2013  Job:  [id number]

## 2013-09-29 NOTE — Anesthesia Postprocedure Evaluation (Signed)
Anesthesia Post Note  Patient: Tamara FallNancy L Salinas  Procedure(s) Performed: Procedure(s) (LRB): RIGHT TOTAL HIP ARTHROPLASTY ANTERIOR APPROACH (Right)  Anesthesia type: Spinal  Patient location: PACU  Post pain: Pain level controlled  Post assessment: Post-op Vital signs reviewed  Last Vitals:  Filed Vitals:   09/29/13 1322  BP: 183/82  Pulse: 81  Temp: 36.4 C  Resp: 16    Post vital signs: Reviewed  Level of consciousness: sedated  Complications: No apparent anesthesia complications

## 2013-09-30 LAB — CBC
HCT: 30.8 % — ABNORMAL LOW (ref 36.0–46.0)
Hemoglobin: 10 g/dL — ABNORMAL LOW (ref 12.0–15.0)
MCH: 29.2 pg (ref 26.0–34.0)
MCHC: 32.5 g/dL (ref 30.0–36.0)
MCV: 90.1 fL (ref 78.0–100.0)
PLATELETS: 231 10*3/uL (ref 150–400)
RBC: 3.42 MIL/uL — AB (ref 3.87–5.11)
RDW: 13.7 % (ref 11.5–15.5)
WBC: 14 10*3/uL — ABNORMAL HIGH (ref 4.0–10.5)

## 2013-09-30 LAB — BASIC METABOLIC PANEL
BUN: 22 mg/dL (ref 6–23)
CO2: 22 mEq/L (ref 19–32)
CREATININE: 0.99 mg/dL (ref 0.50–1.10)
Calcium: 9.3 mg/dL (ref 8.4–10.5)
Chloride: 100 mEq/L (ref 96–112)
GFR calc non Af Amer: 55 mL/min — ABNORMAL LOW (ref >90)
GFR, EST AFRICAN AMERICAN: 63 mL/min — AB (ref >90)
Glucose, Bld: 266 mg/dL — ABNORMAL HIGH (ref 70–99)
Potassium: 4.2 mEq/L (ref 3.7–5.3)
Sodium: 135 mEq/L — ABNORMAL LOW (ref 137–147)

## 2013-09-30 MED ORDER — HYDRALAZINE HCL 20 MG/ML IJ SOLN
10.0000 mg | Freq: Once | INTRAMUSCULAR | Status: AC
  Administered 2013-09-30: 10 mg via INTRAVENOUS
  Filled 2013-09-30: qty 0.5

## 2013-09-30 NOTE — Progress Notes (Signed)
Pt's BP at 0514 was 196/77; her last two readings systolically were in the 180's; Dr. Lajoyce Cornersuda, MD on call, was called and notified of pt's BP; he agreed to go ahead and give pt's blood pressure medication scheduled for 1000 am to now. Will continue to monitor.

## 2013-09-30 NOTE — Progress Notes (Signed)
Patient ID: [name], female   DOB: 1940/09/22, 73 y.o.   MRN: [id number] Patient is progressing slowly with therapy. Anticipate discharge to  skilled nursing facility.

## 2013-09-30 NOTE — Progress Notes (Signed)
Clinical Social Work Department BRIEF PSYCHOSOCIAL ASSESSMENT 09/30/2013  Patient:  [name], [name]     Account Number:  [id number]     Admit date:  09/29/2013  Clinical Social Worker:  Levie Heritage  Date/Time:  09/30/2013 10:38 AM  Referred by:  Physician  Date Referred:  09/30/2013 Referred for  SNF Placement   Other Referral:   Interview type:  Patient Other interview type:    PSYCHOSOCIAL DATA Living Status:  ALONE Admitted from facility:   Level of care:   Primary support name:  Yancey Flemings Primary support relationship to patient:  CHILD, ADULT Degree of support available:   strong    CURRENT CONCERNS Current Concerns  Post-Acute Placement   Other Concerns:    SOCIAL WORK ASSESSMENT / PLAN Met with Pt to discuss d/c plans.    Pt stated that she is agreeable to going to SNF and that she'd love to return to [place]; Pt was there last year and felt that she received good, quality care there.  She did give CSW permission to do a county-wide search, in the off-chance that [facility] is full.    Pt stated that, although she lives alone, she has good familial support close by and that she will be well cared for when she d/cs from SNF.    CSW provided Pt with a SNF list.    CSW thanked Pt for her time.   Assessment/plan status:  Psychosocial Support/Ongoing Assessment of Needs Other assessment/ plan:   Information/referral to community resources:   SNF list    PATIENT'S/FAMILY'S RESPONSE TO PLAN OF CARE: Pt was calm, cooperative and pleasant.  Pt had just returned to bed and was somewhat winded and expressed that she was in pain and was ready to rest.    Pt voiced that she loved [facility] last year when she was there and that she'd be really happy to go back.  She stated that it's close to her home and her family, which is convenient for everyone.    Pt thanked CSW for time and assistance.   Bernita Raisin, [place] Work [phone]

## 2013-09-30 NOTE — Progress Notes (Signed)
Physical Therapy Treatment Patient Details Name: [name] MRN: [id number] DOB: March 24, 1941 Today's Date: 09/30/2013 Time: 1315-1340 PT Time Calculation (min): 25 min  PT Assessment / Plan / Recommendation  History of Present Illness R THR - ant dir   PT Comments   POD #2 pm session.  [facility].  Assisted with hygiene.  Amb limited distance due to MAX c/o fatigue and "more soreness" this afternoon.  Pt progressing slowly and will need ST Rehab at SNF prior to returning home.   Follow Up Recommendations  SNF     Does the patient have the potential to tolerate intense rehabilitation     Barriers to Discharge        Equipment Recommendations  None recommended by PT    Recommendations for Other Services    Frequency 7X/week   Progress towards PT Goals Progress towards PT goals: Progressing toward goals  Plan      Precautions / Restrictions Precautions Precautions: Fall Restrictions Weight Bearing Restrictions: No Other Position/Activity Restrictions: WBAT   Pertinent Vitals/Pain C/o "more soreness"    Mobility  Bed Mobility Assisted OOB with Mod assist to support R LE and increased time with difficulty scooting self to EOB.  Transfers Overall transfer level: Needs assistance Equipment used: Rolling walker (2 wheeled) Transfers: Sit to/from Stand Sit to Stand: Min assist with 25% VC's on proper hand placement General transfer comment: cues for UE and LE position plus increased time Ambulation/Gait Ambulation/Gait assistance: Mod assist;Min assist Ambulation Distance (Feet): 25 Feet Assistive device: Rolling walker (2 wheeled) Gait Pattern/deviations: Step-to pattern;Decreased stance time - right;Trunk flexed Gait velocity: decr General Gait Details: Cues for sequence, stride length, posture and position from RW    PT Goals (current goals can now be found in the care plan section)    Visit Information  Last PT Received On:  09/30/13 Assistance Needed: +1 History of Present Illness: R THR - ant dir    Subjective Data      Cognition       Balance     End of Session PT - End of Session Equipment Utilized During Treatment: Gait belt Activity Tolerance: Patient tolerated treatment well Patient left: in chair;with call bell/phone within reach Nurse Communication: Mobility status   Felecia ShellingLori Talicia Sui  PTA [facility]  Acute  Rehab Pager      [phone]

## 2013-09-30 NOTE — Evaluation (Signed)
Occupational Therapy Evaluation Patient Details Name: [name] MRN: [id number] DOB: May 25, 1941 Today's Date: 09/30/2013 Time: 0801-0827 OT Time Calculation (min): 26 min  OT Assessment / Plan / Recommendation History of present illness R THR - ant dir   Clinical Impression   Pt was admitted for the above surgery.  She will benefit from skilled OT to  Increase safety and independence with adls.  Pt was limited with LB adls by pain.  Goals in acute are for supervision to mod A.  She needed max to total A x 1 with LB adls at time of eval.      OT Assessment  Patient needs continued OT Services    Follow Up Recommendations  SNF    Barriers to Discharge      Equipment Recommendations  None recommended by OT    Recommendations for Other Services    Frequency  Min 2X/week    Precautions / Restrictions Precautions Precautions: Fall Restrictions Other Position/Activity Restrictions: WBAT   Pertinent Vitals/Pain 5/10 in bed; 9/10 with weight bearing:  Requested pain meds and repositioned    ADL  Grooming: Set up Where Assessed - Grooming: Unsupported sitting Upper Body Bathing: Set up Where Assessed - Upper Body Bathing: Unsupported sitting Lower Body Bathing: Maximal assistance Where Assessed - Lower Body Bathing: Supported sit to stand Upper Body Dressing: Minimal assistance (iv) Where Assessed - Upper Body Dressing: Supported sit to stand Lower Body Dressing: +1 Total assistance Where Assessed - Lower Body Dressing: Supported sit to Pharmacist, hospitalstand Toilet Transfer: Minimal Dentistassistance Toilet Transfer Method: Stand pivot Toileting - Clothing Manipulation and Hygiene: Maximal assistance Where Assessed - Engineer, miningToileting Clothing Manipulation and Hygiene: Sit to stand from 3-in-1 or toilet Equipment Used: Rolling walker Transfers/Ambulation Related to ADLs: spt from bed to chair with min A ADL Comments:Performed ADL from EOB. Pt with increased pain during WB.  Needed assist with peri  care.  Educated on AE but did not use    OT Diagnosis: Generalized weakness  OT Problem List: Decreased strength;Decreased activity tolerance;Pain;Decreased knowledge of use of DME or AE OT Treatment Interventions: Self-care/ADL training;DME and/or AE instruction;Patient/family education   OT Goals(Current goals can be found in the care plan section) Acute Rehab OT Goals Patient Stated Goal: go to [facility] to regain independence OT Goal Formulation: With patient Time For Goal Achievement: 10/07/13 Potential to Achieve Goals: Good ADL Goals Pt Will Perform Grooming: with supervision;standing Pt Will Perform Lower Body Bathing: with min assist;with adaptive equipment;sit to/from stand Pt Will Perform Lower Body Dressing: with mod assist;with adaptive equipment;sit to/from stand Pt Will Transfer to Toilet: with min guard assist;ambulating;bedside commode Pt Will Perform Toileting - Clothing Manipulation and hygiene: with min guard assist;sit to/from stand  Visit Information  Last OT Received On: 09/30/13 Assistance Needed: +1 History of Present Illness: R THR - ant dir       Prior Functioning     Home Living Family/patient expects to be discharged to:: Skilled nursing facility Living Arrangements: Alone Prior Function Level of Independence: Independent with assistive device(s) Communication Communication: No difficulties Dominant Hand: Right         Vision/Perception     Cognition  Cognition Arousal/Alertness: Awake/alert Behavior During Therapy: WFL for tasks assessed/performed Overall Cognitive Status: Within Functional Limits for tasks assessed    Extremity/Trunk Assessment Upper Extremity Assessment Upper Extremity Assessment: Overall WFL for tasks assessed     Mobility Bed Mobility Bed Mobility: Supine to Sit Supine to sit: Min assist General bed mobility comments:  cues for technique; assist with LLE, used rails, HOB 45 Transfers Transfers: Sit to/from  Stand Sit to Stand: Min assist General transfer comment: cues for UE and LE position     Exercise     Balance     End of Session OT - End of Session Activity Tolerance: Patient limited by pain Patient left: in chair;with call bell/phone within reach  GO     [facility] 09/30/2013, 9:35 AM Marica Otter, OTR/L [phone] 09/30/2013

## 2013-09-30 NOTE — Progress Notes (Signed)
Clinical Social Work Department CLINICAL SOCIAL WORK PLACEMENT NOTE 09/30/2013  Patient:  [name],[name]  Account Number:  [id number] Admit date:  09/29/2013  Clinical Social Worker:  Doroteo GlassmanAMANDA Leotis Isham, LCSWA  Date/time:  09/30/2013 10:45 AM  Clinical Social Work is seeking post-discharge placement for this patient at the following level of care:   SKILLED NURSING   (*CSW will update this form in Epic as items are completed)   09/30/2013  [facility]/family provided with [facility] of Clinical [facility] Work's list of facilities offering this level of care within the geographic area requested by the patient (or if unable, by the patient's family).  09/30/2013  Patient/family informed of their freedom to choose among providers that offer the needed level of care, that participate in Medicare, Medicaid or managed care program needed by the patient, have an available bed and are willing to accept the patient.  09/30/2013  Patient/family informed of [facility]' ownership interest in [facility], as well as of the fact that they are under no obligation to receive care at this facility.  PASARR submitted to EDS on existing PASARR number received from EDS on   FL2 transmitted to all facilities in geographic area requested by pt/family on  09/30/2013 FL2 transmitted to all facilities within larger geographic area on   Patient informed that his/her managed care company has contracts with or will negotiate with  certain facilities, including the following:     Patient/family informed of bed offers received:   Patient chooses bed at  Physician recommends and patient chooses bed at    Patient to be transferred to  on   Patient to be transferred to facility by   The following physician request were entered in Epic:   Additional Comments:  Providence CrosbyAmanda Larwence Tu, Theresia MajorsLCSWA Clinical Social Work [phone]

## 2013-09-30 NOTE — Progress Notes (Signed)
Physical Therapy Treatment Patient Details Name: [name] MRN: [id number] DOB: 22-Sep-1940 Today's Date: 09/30/2013 Time: [id number] PT Time Calculation (min): 25 min  PT Assessment / Plan / Recommendation  History of Present Illness R THR - ant dir   PT Comments   POD # 1 am session.  Pt OOB in recliner.  Assisted with amb limited distance due to p[ain/fatigue.  Performed THR TE's followed by ICE.  Pt progressing slowly and will need ST Rehab at SNF to achieve safe level of mobility.  Follow Up Recommendations  SNF     Does the patient have the potential to tolerate intense rehabilitation     Barriers to Discharge        Equipment Recommendations  None recommended by PT    Recommendations for Other Services    Frequency 7X/week   Progress towards PT Goals Progress towards PT goals: Progressing toward goals  Plan      Precautions / Restrictions Precautions Precautions: Fall Restrictions Weight Bearing Restrictions: No Other Position/Activity Restrictions: WBAT   Pertinent Vitals/Pain C/o 8/10 pain    Mobility  Bed Mobility General bed mobility comments: Pt OOB in recliner Transfers Overall transfer level: Needs assistance Equipment used: Rolling walker (2 wheeled) Transfers: Sit to/from Stand Sit to Stand: Min assist General transfer comment: cues for UE and LE position plus increased time Ambulation/Gait Ambulation/Gait assistance: Mod assist;Min assist Ambulation Distance (Feet): 25 Feet Assistive device: Rolling walker (2 wheeled) Gait Pattern/deviations: Step-to pattern;Decreased stance time - right;Trunk flexed Gait velocity: decr General Gait Details: Cues for sequence, stride length, posture and position from RW    Exercises   Total Hip Replacement TE's 10 reps ankle pumps 10 reps knee presses 10 reps heel slides 10 reps SAQ's 10 reps ABD Followed by ICE    PT Goals (current goals can now be found in the care plan section)    Visit  Information  Last PT Received On: 09/30/13 Assistance Needed: +1 History of Present Illness: R THR - ant dir    Subjective Data      Cognition       Balance     End of Session PT - End of Session Equipment Utilized During Treatment: Gait belt Activity Tolerance: Patient tolerated treatment well Patient left: in chair;with call bell/phone within reach Nurse Communication: Mobility status   Felecia ShellingLori Eivin Mascio  PTA [facility]  Acute  Rehab Pager      [phone]

## 2013-10-01 LAB — CBC
HCT: 29.1 % — ABNORMAL LOW (ref 36.0–46.0)
Hemoglobin: 9.9 g/dL — ABNORMAL LOW (ref 12.0–15.0)
MCH: 29.7 pg (ref 26.0–34.0)
MCHC: 34 g/dL (ref 30.0–36.0)
MCV: 87.4 fL (ref 78.0–100.0)
PLATELETS: 229 10*3/uL (ref 150–400)
RBC: 3.33 MIL/uL — ABNORMAL LOW (ref 3.87–5.11)
RDW: 13.5 % (ref 11.5–15.5)
WBC: 13.2 10*3/uL — AB (ref 4.0–10.5)

## 2013-10-01 NOTE — Progress Notes (Signed)
Pt's BP noted to be elevated at 204/72, pt is asymptomatic. Pt does report a 7/10 soreness at the surgical site of the right hip, this is treated with PRN pain medication. Oncall notified, Dr. Lajoyce Cornersuda ordered IV hydralazine. Pt BP noted to be 147/74 after hydralazine given. Will continue to monitor.

## 2013-10-01 NOTE — Progress Notes (Signed)
Patient ID: Tamara FallNancy L Kegg, female   DOB: Apr 02, 1941, 73 y.o.   MRN: [id number] Patient comfortable this morning awaiting discharge to  skilled nursing facility Monday. Hemoglobin is stable.

## 2013-10-01 NOTE — Progress Notes (Signed)
Physical Therapy Treatment Patient Details Name: [name] MRN: [id number] DOB: [birth year] Today's Date: 10/01/2013 Time: [id number] PT Time Calculation (min): 28 min  PT Assessment / Plan / Recommendation  History of Present Illness R THR - ant dir   PT Comments   Pt improving in mobility daily. Plans for SNF tomorrow.  Follow Up Recommendations  SNF     Does the patient have the potential to tolerate intense rehabilitation     Barriers to Discharge        Equipment Recommendations  None recommended by PT    Recommendations for Other Services    Frequency 7X/week   Progress towards PT Goals Progress towards PT goals: Progressing toward goals  Plan Current plan remains appropriate    Precautions / Restrictions Precautions Precautions: Fall   Pertinent Vitals/Pain R thigh is stiff.   Mobility  Bed Mobility Bed Mobility: Supine to Sit Supine to sit: HOB elevated;Mod assist General bed mobility comments: rail used, need to pull trunk to upright. Transfers Overall transfer level: Needs assistance Equipment used: Rolling walker (2 wheeled) Transfers: Sit to/from Stand Sit to Stand: Min assist General transfer comment: cues for UE and LE position plus increased time Ambulation/Gait Ambulation/Gait assistance: Min assist Ambulation Distance (Feet): 50 Feet Gait Pattern/deviations: Step-to pattern;Decreased step length - right;Decreased stance time - right;Trunk flexed Gait velocity: decr General Gait Details: Cues for sequence, stride length, posture and position from RW, difficulty advancing R leg first.    Exercises Total Joint Exercises Ankle Circles/Pumps: AROM;Both;10 reps;Supine Quad Sets: AROM;Both;10 reps;Supine Short Arc Quad: AROM;Right;10 reps Heel Slides: AAROM;Right;15 reps;Supine Hip ABduction/ADduction: AAROM;Right;15 reps;Supine   PT Diagnosis:    PT Problem List:   PT Treatment Interventions:     PT Goals (current goals can now be found in  the care plan section)    Visit Information  Last PT Received On: 10/01/13 Assistance Needed: +1 History of Present Illness: R THR - ant dir    Subjective Data      Cognition  Cognition Arousal/Alertness: Awake/alert    Balance     End of Session PT - End of Session Activity Tolerance: Patient tolerated treatment well Patient left: in chair;with call bell/phone within reach   GP     Rada HayHill, Devory Mckinzie Elizabeth 10/01/2013, 3:37 PM Blanchard KelchKaren Ashia Dehner PT [phone]

## 2013-10-02 LAB — CBC
HEMATOCRIT: 29.8 % — AB (ref 36.0–46.0)
HEMOGLOBIN: 9.7 g/dL — AB (ref 12.0–15.0)
MCH: 29 pg (ref 26.0–34.0)
MCHC: 32.6 g/dL (ref 30.0–36.0)
MCV: 89.2 fL (ref 78.0–100.0)
Platelets: 222 10*3/uL (ref 150–400)
RBC: 3.34 MIL/uL — AB (ref 3.87–5.11)
RDW: 13.8 % (ref 11.5–15.5)
WBC: 11.3 10*3/uL — AB (ref 4.0–10.5)

## 2013-10-02 MED ORDER — OXYCODONE-ACETAMINOPHEN 5-325 MG PO TABS: 1.0000 | ORAL_TABLET | ORAL | Status: AC | PRN

## 2013-10-02 MED ORDER — ASPIRIN 325 MG PO TBEC: 325.0000 mg | DELAYED_RELEASE_TABLET | Freq: Two times a day (BID) | ORAL | Status: AC

## 2013-10-02 NOTE — Progress Notes (Signed)
Clinical Social Work Department CLINICAL SOCIAL WORK PLACEMENT NOTE 10/02/2013  Patient:  [name],[name]  Account Number:  [id number] Admit date:  09/29/2013  Clinical Social Worker:  Doroteo GlassmanAMANDA SIMPSON, LCSWA  Date/time:  09/30/2013 10:45 AM  Clinical Social Work is seeking post-discharge placement for this patient at the following level of care:   SKILLED NURSING   (*CSW will update this form in Epic as items are completed)   09/30/2013  Patient/family provided with [facility] Department of Clinical Social Work's list of facilities offering this level of care within the geographic area requested by the patient (or if unable, by the patient's family).  09/30/2013  Patient/family informed of their freedom to choose among providers that offer the needed level of care, that participate in Medicare, Medicaid or managed care program needed by the patient, have an available bed and are willing to accept the patient.  09/30/2013  Patient/family informed of [facility]' ownership interest in [facility], as well as of the fact that they are under no obligation to receive care at this facility.  PASARR submitted to EDS on  PASARR number received from EDS on   FL2 transmitted to all facilities in geographic area requested by pt/family on  09/30/2013 FL2 transmitted to all facilities within larger geographic area on   Patient informed that his/her managed care company has contracts with or will negotiate with  certain facilities, including the following:     Patient/family informed of bed offers received:  10/02/2013 [facility] Physician recommends and patient chooses bed at    Patient to be transferred to  on  10/02/2013 Patient to be transferred to facility by [facility]  The following physician request were entered in Epic:   Additional Comments: Blue Medicare provided auth for SNF / AMB transport. Pt is aware she may have co  payments.   Cori RazorJamie Kashana Breach LCSW [phone]

## 2013-10-02 NOTE — Discharge Summary (Signed)
Patient ID: [name] L Mau MRN: [id number] DOB/AGE: 1940-09-09 73 y.o.  Admit date: 09/29/2013 Discharge date: 10/02/2013  Admission Diagnoses:  Principal Problem:   Arthritis of right hip Active Problems:   Status post THR (total hip replacement)   Discharge Diagnoses:  S/P right total hip arhtroplasty Acute Blood loss anemia secondary to surgery asymptomatic   Past Medical History  Diagnosis Date  . Hypertension   . Lung fibrosis   . Diabetes mellitus without complication   . Headache(784.0)     occasional headache   . Arthritis   . Gout     no recent flares    Surgeries: Procedure(s): RIGHT TOTAL HIP ARTHROPLASTY ANTERIOR APPROACH on 09/29/2013   Consultants:    Discharged Condition: Improved  Hospital Course: [name] L Tardif is an 73 y.o. female who was admitted 09/29/2013 for operative treatment ofArthritis of right hip. Patient has severe unremitting pain that affects sleep, daily activities, and work/hobbies. After pre-op clearance the patient was taken to the operating room on 09/29/2013 and underwent  Procedure(s): RIGHT TOTAL HIP ARTHROPLASTY ANTERIOR APPROACH.    Patient was given perioperative antibiotics: Anti-infectives   Start     Dose/Rate Route Frequency Ordered Stop   09/29/13 1400  clindamycin (CLEOCIN) IVPB 600 mg     600 mg 100 mL/hr over 30 Minutes Intravenous Every 6 hours 09/29/13 1130 09/29/13 2029   09/29/13 0525  clindamycin (CLEOCIN) IVPB 900 mg     900 mg 100 mL/hr over 30 Minutes Intravenous On call to O.R. 09/29/13 0525 09/29/13 0730       Patient was given sequential compression devices, early ambulation, and chemoprophylaxis to prevent DVT.  Patient benefited maximally from hospital stay and there were no complications.    Recent vital signs: Patient Vitals for the past 24 hrs:  BP Temp Temp src Pulse Resp SpO2  10/02/13 0500 148/80 mmHg 99.2 F (37.3 C) Oral 92 18 95 %  10/02/13 0400 - - - - 16 95 %  10/02/13 0000 - - - - 16  98 %  10/01/13 2115 169/75 mmHg 97.4 F (36.3 C) Oral 99 18 97 %  10/01/13 2000 - - - - 18 98 %  10/01/13 1600 - - - - 18 100 %  10/01/13 1500 134/77 mmHg 100.3 F (37.9 C) Oral 102 18 98 %  10/01/13 1200 - - - - 18 98 %     Recent laboratory studies:  Recent Labs  09/30/13 0444 10/01/13 0530 10/02/13 0425  WBC 14.0* 13.2* 11.3*  HGB 10.0* 9.9* 9.7*  HCT 30.8* 29.1* 29.8*  PLT 231 229 222  NA 135*  --   --   K 4.2  --   --   CL 100  --   --   CO2 22  --   --   BUN 22  --   --   CREATININE 0.99  --   --   GLUCOSE 266*  --   --   CALCIUM 9.3  --   --      Discharge Medications:     Medication List    STOP taking these medications       GOODY HEADACHE PO     oxyCODONE-acetaminophen 10-325 MG per tablet  Commonly known as:  PERCOCET  Replaced by:  oxyCODONE-acetaminophen 5-325 MG per tablet      TAKE these medications       allopurinol 100 MG tablet  Commonly known as:  ZYLOPRIM  Take 100 mg by  mouth every morning.     aspirin 325 MG EC tablet  Take 1 tablet (325 mg total) by mouth 2 (two) times daily after a meal.     atorvastatin 40 MG tablet  Commonly known as:  LIPITOR  Take 40 mg by mouth every morning.     glimepiride 4 MG tablet  Commonly known as:  AMARYL  Take 4 mg by mouth daily before breakfast.     guaiFENesin-codeine 100-10 MG/5ML syrup  Commonly known as:  ROBITUSSIN AC  Take 5 mLs by mouth 3 (three) times daily as needed for cough.     hydrALAZINE 25 MG tablet  Commonly known as:  APRESOLINE  Take 25 mg by mouth every morning.     losartan-hydrochlorothiazide 100-25 MG per tablet  Commonly known as:  HYZAAR  Take 1 tablet by mouth every morning.     nebivolol 10 MG tablet  Commonly known as:  BYSTOLIC  Take 10 mg by mouth every morning.     ONGLYZA 5 MG Tabs tablet  Generic drug:  saxagliptin HCl  Take 5 mg by mouth every morning.     oxyCODONE-acetaminophen 5-325 MG per tablet  Commonly known as:  ROXICET  Take 1-2 tablets  by mouth every 4 (four) hours as needed for severe pain.     TEKTURNA 150 MG tablet  Generic drug:  aliskiren  Take 150 mg by mouth every morning.     Vitamin D (Ergocalciferol) 50000 UNITS Caps capsule  Commonly known as:  DRISDOL  Take 50,000 Units by mouth 2 (two) times a week. Tuesday and Friday        Diagnostic Studies: Dg Hip Complete Right  09/29/2013   CLINICAL DATA:  73 year old female undergoing in right hip surgery. Initial encounter.  EXAM: RIGHT HIP - COMPLETE 2+ VIEW; DG C-ARM 1-60 MIN - NRPT [facility]  COMPARISON:  Pelvis radiograph 11/18/2012.  FLUOROSCOPY TIME:  0 min 30 seconds.  FINDINGS: Two intraoperative AP views of the right hip and pelvis 0839 hrs. New right side bipolar hip arthroplasty hardware. Hardware appears intact. Preexisting left hip arthroplasty.  IMPRESSION: New right hip arthroplasty.  No adverse features identified.   Electronically Signed   By: Augusto Gamble M.D.   On: 09/29/2013 08:54   Dg Pelvis Portable  09/29/2013   CLINICAL DATA:  Right total hip replacement, immediate postoperative.  EXAM: PORTABLE PELVIS 1-2 VIEWS  COMPARISON:  DG C-ARM 1-60 MIN-NO REPORT dated 09/29/2013; DG PELVIS PORTABLE dated 11/18/2012  FINDINGS: Low centered frontal view of the pelvis demonstrates bilateral total hip prostheses in place. Expected alignment and lateral inclination of the acetabular shell. No fracture or immediate complicating feature is observed.  Spurring along the pubic symphysis and left sacroiliac joint noted.  IMPRESSION: 1. Interval right total hip prosthesis placement without fracture or complicating feature identified.   Electronically Signed   By: Herbie Baltimore M.D.   On: 09/29/2013 09:46   Dg Hip Portable 1 View Right  09/29/2013   CLINICAL DATA:  Right total hip placement, immediate postoperative.  EXAM: PORTABLE RIGHT HIP - 1 VIEW  COMPARISON:  DG HIP COMPLETE*R* dated 09/29/2013; HIP - BILATERAL dated 10/22/2002  FINDINGS: Cross-table lateral view of the hips  noted. No compelling evidence of fracture or acute complicating feature.  IMPRESSION: Negative.   Electronically Signed   By: Herbie Baltimore M.D.   On: 09/29/2013 09:48   Dg C-arm 1-60 Min-no Report  09/29/2013   CLINICAL DATA:  73 year old female undergoing in  right hip surgery. Initial encounter.  EXAM: RIGHT HIP - COMPLETE 2+ VIEW; DG C-ARM 1-60 MIN - NRPT [facility]  COMPARISON:  Pelvis radiograph 11/18/2012.  FLUOROSCOPY TIME:  0 min 30 seconds.  FINDINGS: Two intraoperative AP views of the right hip and pelvis 0839 hrs. New right side bipolar hip arthroplasty hardware. Hardware appears intact. Preexisting left hip arthroplasty.  IMPRESSION: New right hip arthroplasty.  No adverse features identified.   Electronically Signed   By: Augusto Gamble M.D.   On: 09/29/2013 08:54    Disposition: 03-Skilled Nursing Facility      Discharge Orders   Future Orders Complete By Expires   Discharge wound care:  As directed    Comments:     Keep dressing clean and intact. May shower with dressing intact . Remove dressing Thursday and shower. After showering apply clean dressing.   Weight bearing as tolerated  As directed    Questions:     Laterality:     Extremity:        Follow-up Information   Follow up with Kathryne Hitch, MD. Schedule an appointment as soon as possible for a visit in 2 weeks.   Specialty:  Orthopedic Surgery   Contact information:   [address] [place] [address] [phone]        Signed: Richardean Canal 10/02/2013, 8:19 AM

## 2013-10-02 NOTE — Progress Notes (Signed)
Subjective: 3 Days Post-Op Procedure(s) (LRB): RIGHT TOTAL HIP ARTHROPLASTY ANTERIOR APPROACH (Right) Patient reports pain as moderate.    Objective: Vital signs in last 24 hours: Temp:  [97.4 F (36.3 C)-100.3 F (37.9 C)] 99.2 F (37.3 C) (03/09 0500) Pulse Rate:  [92-102] 92 (03/09 0500) Resp:  [16-18] 18 (03/09 0500) BP: (134-169)/(75-80) 148/80 mmHg (03/09 0500) SpO2:  [95 %-100 %] 95 % (03/09 0500)  Intake/Output from previous day: 03/08 0701 - 03/09 0700 In: 600 [P.O.:600] Out: 400 [Urine:400] Intake/Output this shift:     Recent Labs  09/30/13 0444 10/01/13 0530 10/02/13 0425  HGB 10.0* 9.9* 9.7*    Recent Labs  10/01/13 0530 10/02/13 0425  WBC 13.2* 11.3*  RBC 3.33* 3.34*  HCT 29.1* 29.8*  PLT 229 222    Recent Labs  09/30/13 0444  NA 135*  K 4.2  CL 100  CO2 22  BUN 22  CREATININE 0.99  GLUCOSE 266*  CALCIUM 9.3   No results found for this basename: LABPT, INR,  in the last 72 hours  Sensation intact distally Intact pulses distally Dorsiflexion/Plantar flexion intact Incision: dressing C/D/I Compartment soft  Assessment/Plan: 3 Days Post-Op Procedure(s) (LRB): RIGHT TOTAL HIP ARTHROPLASTY ANTERIOR APPROACH (Right) [facility]  Dorin Stooksbury 10/02/2013, 8:18 AM

## 2014-08-18 IMAGING — RF DG HIP (WITH OR WITHOUT PELVIS) 2-3V*L*
1 series · 2 of 2 positions shown · non-contrast
Comparison: 08/15/2010

CLINICAL DATA: Left total arthroplasty

DG C-ARM 61-120 MIN - NRPT [facility],LEFT HIP - COMPLETE 2+ VIEW

[Series 1: run · 2 of 2 slices shown]
[im 1/2]
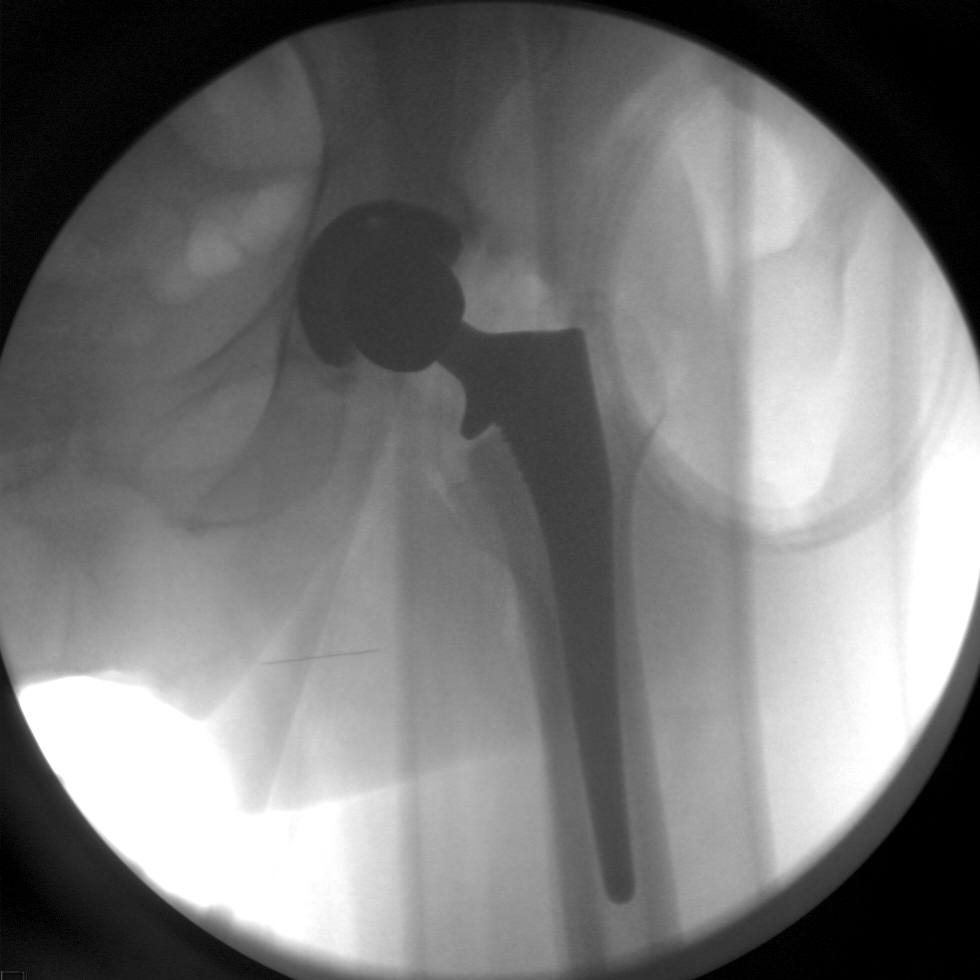
[im 2/2]
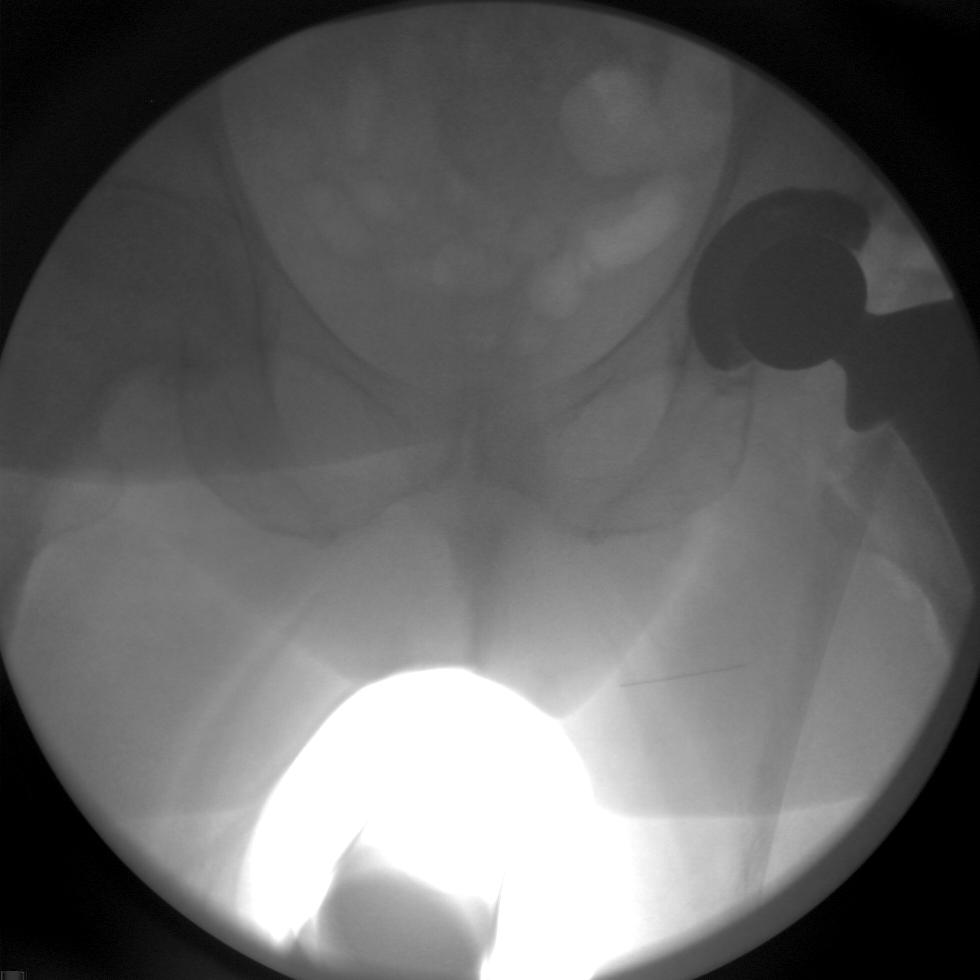

[2 of 2 positions shown; findings below may reference images not displayed]

FINDINGS: Two intraoperative fluoroscopic images demonstrate
expected intraoperative appearance of left total hip arthroplasty.
IMPRESSION: Expected intraoperative appearance of left total hip arthroplasty.

## 2015-06-29 IMAGING — CR DG HIP 1V PORT*R*
1 series · 1 of 1 positions shown · non-contrast
Comparison: DG HIP COMPLETE*R* dated 09/29/2013; HIP - BILATERAL
dated 10/22/2002

CLINICAL DATA: Right total hip placement, immediate postoperative.

EXAM:
PORTABLE RIGHT HIP - 1 VIEW

[shoot- thru lateral]
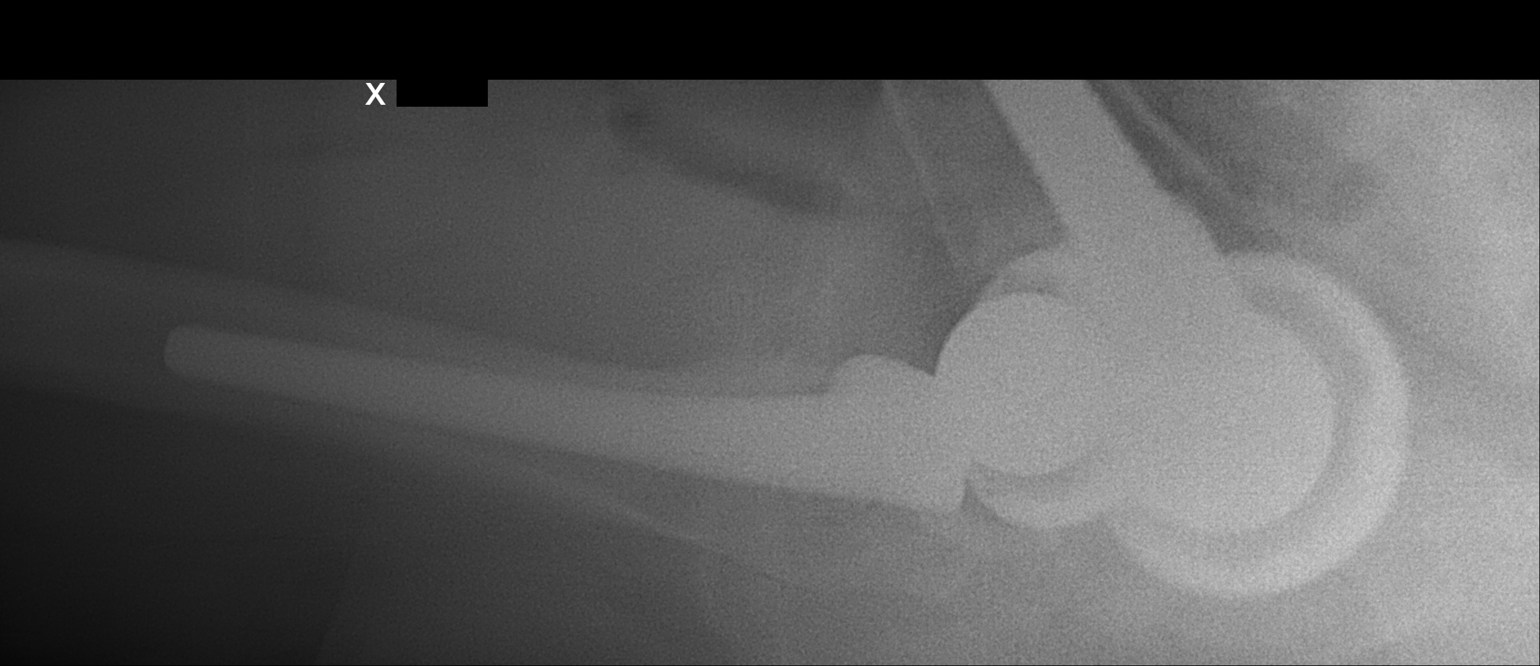

[1 of 1 positions shown; findings below may reference images not displayed]

FINDINGS: Cross-table lateral view of the hips noted. No compelling evidence
of fracture or acute complicating feature.
IMPRESSION: Negative.

## 2016-06-10 ENCOUNTER — Ambulatory Visit (INDEPENDENT_AMBULATORY_CARE_PROVIDER_SITE_OTHER): Payer: Self-pay | Admitting: Orthopaedic Surgery

## 2016-07-08 ENCOUNTER — Ambulatory Visit (INDEPENDENT_AMBULATORY_CARE_PROVIDER_SITE_OTHER): Payer: Self-pay | Admitting: Orthopaedic Surgery

## 2016-07-14 ENCOUNTER — Encounter (INDEPENDENT_AMBULATORY_CARE_PROVIDER_SITE_OTHER): Payer: Self-pay | Admitting: Orthopaedic Surgery

## 2016-07-14 ENCOUNTER — Ambulatory Visit (INDEPENDENT_AMBULATORY_CARE_PROVIDER_SITE_OTHER): Payer: Medicare Other | Admitting: Orthopaedic Surgery

## 2016-07-14 DIAGNOSIS — M25562 Pain in left knee: Secondary | ICD-10-CM

## 2016-07-14 DIAGNOSIS — G8929 Other chronic pain: Secondary | ICD-10-CM | POA: Diagnosis not present

## 2016-07-14 DIAGNOSIS — M1712 Unilateral primary osteoarthritis, left knee: Secondary | ICD-10-CM

## 2016-07-14 MED ORDER — LIDOCAINE HCL 1 % IJ SOLN
3.0000 mL | INTRAMUSCULAR | Status: AC | PRN
  Administered 2016-07-14: 3 mL

## 2016-07-14 MED ORDER — METHYLPREDNISOLONE ACETATE 40 MG/ML IJ SUSP
40.0000 mg | INTRAMUSCULAR | Status: AC | PRN
  Administered 2016-07-14: 40 mg via INTRA_ARTICULAR

## 2016-07-14 MED ORDER — TRAMADOL HCL 50 MG PO TABS: 50.0000 mg | ORAL_TABLET | Freq: Two times a day (BID) | ORAL | 0 refills | Status: AC | PRN

## 2016-07-14 NOTE — Progress Notes (Signed)
Office Visit Note   Patient: [name]           Date of Birth: 20-Jan-1941           MRN: [id number] Visit Date: 07/14/2016              Requested by: Shelbie AmmonsImran P Haque, MD [address] [place], [place] [address] PCP: Galvin ProfferHAGUE, IMRAN P, MD   Assessment & Plan: Visit Diagnoses:  1. Chronic pain of left knee   2. Unilateral primary osteoarthritis, left knee     Plan: She tolerated the injection well and her left knee without difficulties. She no she can come back at any time to consider repeat injection at least 3-4 months out from each injection. At some point she may consider knee replacement surgery but she says she doesn't need at this point since injections are helping. I did give her prescription for tramadol to take as needed.  Follow-Up Instructions: Return if symptoms worsen or fail to improve.   Orders:  No orders of the defined types were placed in this encounter.  No orders of the defined types were placed in this encounter.     Procedures: Large Joint Inj Date/Time: 07/14/2016 8:29 AM Performed by: Kathryne HitchBLACKMAN, Carl Butner Y Authorized by: Kathryne HitchBLACKMAN, Rindi Beechy Y   Location:  Knee Site:  L knee Ultrasound Guidance: No   Fluoroscopic Guidance: No   Arthrogram: No   Medications:  3 mL lidocaine 1 %; 40 mg methylPREDNISolone acetate 40 MG/ML     Clinical Data: No additional findings.   Subjective: Chief Complaint  Patient presents with  . Left Knee - Pain   She is well-known to me. She has a history of bilateral hip replacements she says are doing well. She does see me at time to time for her left arthritic knee. She last had injection in July 2017. She would like to have injection in her left knee today. She otherwise reports that she is doing well. HPI  Review of Systems She denies any chest pain, headache, shortness of breath, fever, chills, nausea, vomiting.  Objective: Vital Signs: There were no vitals taken for this visit.  Physical  Exam She is alert and oriented 3. Ortho Exam Examination of her left knee shows excellent range of motion with some patellofemoral crepitation and a slight varus deformity with global tenderness but no redness and no instability on exam. Both her hips have fluid range of motion with no difficulties at all. Specialty Comments:  No specialty comments available.  Imaging: No results found.   PMFS History: Patient Active Problem List   Diagnosis Date Noted  . Arthritis of right hip 09/29/2013  . Status post THR (total hip replacement) 09/29/2013  . Degenerative arthritis of hip 11/18/2012   Past Medical History:  Diagnosis Date  . Arthritis   . Diabetes mellitus without complication (HCC)   . Gout    no recent flares  . Headache(784.0)    occasional headache   . Hypertension   . Lung fibrosis (HCC)     No family history on file.  Past Surgical History:  Procedure Laterality Date  . BACK SURGERY   10 yrs ago   lumbar fusion   . buinion removed Left nov 11 , 2014  . JOINT REPLACEMENT  10 yrs ago   right knee replacement   . TOTAL HIP ARTHROPLASTY Left 11/18/2012   Procedure: LEFT TOTAL HIP ARTHROPLASTY ANTERIOR APPROACH;  Surgeon: Kathryne Hitchhristopher Y Glyndon Tursi, MD;  [facility];  Service: Orthopedics;  Laterality: Left;  . TOTAL HIP ARTHROPLASTY Right 09/29/2013   Procedure: RIGHT TOTAL HIP ARTHROPLASTY ANTERIOR APPROACH;  Surgeon: Kathryne Hitchhristopher Y Valarie Farace, MD;  Location: [facility];  Service: Orthopedics;  Laterality: Right;   Social History   Occupational History  . Not on file.   Social History Main Topics  . Smoking status: Never Smoker  . Smokeless tobacco: Never Used  . Alcohol use No  . Drug use: No  . Sexual activity: Not on file

## 2017-04-28 ENCOUNTER — Ambulatory Visit (INDEPENDENT_AMBULATORY_CARE_PROVIDER_SITE_OTHER): Payer: Medicare HMO | Admitting: Orthopaedic Surgery

## 2017-04-28 ENCOUNTER — Ambulatory Visit (INDEPENDENT_AMBULATORY_CARE_PROVIDER_SITE_OTHER): Payer: Medicare HMO

## 2017-04-28 DIAGNOSIS — G8929 Other chronic pain: Secondary | ICD-10-CM

## 2017-04-28 DIAGNOSIS — M25562 Pain in left knee: Secondary | ICD-10-CM

## 2017-04-28 DIAGNOSIS — Z96641 Presence of right artificial hip joint: Secondary | ICD-10-CM

## 2017-04-28 MED ORDER — METHYLPREDNISOLONE ACETATE 40 MG/ML IJ SUSP
40.0000 mg | INTRAMUSCULAR | Status: AC | PRN
  Administered 2017-04-28: 40 mg via INTRA_ARTICULAR

## 2017-04-28 MED ORDER — LIDOCAINE HCL 1 % IJ SOLN
3.0000 mL | INTRAMUSCULAR | Status: AC | PRN
  Administered 2017-04-28: 3 mL

## 2017-04-28 MED ORDER — HYDROCODONE-ACETAMINOPHEN 5-325 MG PO TABS: 1.0000 | ORAL_TABLET | Freq: Three times a day (TID) | ORAL | 0 refills | Status: AC | PRN

## 2017-04-28 NOTE — Progress Notes (Signed)
Office Visit Note   Patient: [name]           Date of Birth: 09/08/1940           MRN: [id number] Visit Date: 04/28/2017              Requested by: Shelbie Ammons, MD [address] [place], [place] [address] PCP: Shelbie Ammons, MD   Assessment & Plan: Visit Diagnoses:  1. History of right hip replacement   2. Chronic pain of left knee     Plan: I gave her reassurance that her hips look good. I do feel the bursitis comes more from dealing with an arthritic left knee. I agree with trying injection in her left knee and counseled about the risk of increasing her blood glucose due to this. She tolerated the injection well. She'll follow up as needed. If we do see her back for her knee at any time I would like an AP and lateral of the left knee.  Follow-Up Instructions: Return if symptoms worsen or fail to improve.   Orders:  Orders Placed This Encounter  Procedures  . Large Joint Injection/Arthrocentesis  . XR HIP UNILAT W OR W/O PELVIS 1V RIGHT   Meds ordered this encounter  Medications  . HYDROcodone-acetaminophen (NORCO/VICODIN) 5-325 MG tablet    Sig: Take 1 tablet by mouth 3 (three) times daily as needed for moderate pain.    Dispense:  60 tablet    Refill:  0      Procedures: Large Joint Inj Date/Time: 04/28/2017 4:39 PM Performed by: Kathryne Hitch Authorized by: Kathryne Hitch   Location:  Knee Site:  L knee Ultrasound Guidance: No   Fluoroscopic Guidance: No   Arthrogram: No   Medications:  3 mL lidocaine 1 %; 40 mg methylPREDNISolone acetate 40 MG/ML     Clinical Data: No additional findings.   Subjective: No chief complaint on file. The patient is well-known to me. She is 4 years out from hip replacements that were done about a year part. She is doing well as far as her hips but she does get bursitis in both her hips. She does have chronic left knee pain with known osteoporosis in her knee. She's had a right total  knee arthroplasty as well. She like to have a steroid injection her left knee today. We will get x-rays of her hips as well today to see other doing overall. She is a diabetic but reports good control.  HPI  Review of Systems He currently denies any headache, chest pain, short of breath, fever, chills, nausea, vomiting.  Objective: Vital Signs: There were no vitals taken for this visit.  Physical Exam She is alert and oriented 3 and in no acute distress Ortho Exam Examination of both hips show fluid range of motion with no pain. Her incisions are good. She does have slight trochanteric bursitis bilaterally. Her left knee on exam she has pain Clark of motion with patellofemoral crepitation as well as medial lateral joint line tenderness. Specialty Comments:  No specialty comments available.  Imaging: Xr Hip Unilat W Or W/o Pelvis 1v Right  Result Date: 04/28/2017 An AP pelvis shows bilateral hip replacements with no evidence of loosening or complicating features.    PMFS History: Patient Active Problem List   Diagnosis Date Noted  . Arthritis of right hip 09/29/2013  . Status post THR (total hip replacement) 09/29/2013  . Degenerative arthritis of hip 11/18/2012   Past  Medical History:  Diagnosis Date  . Arthritis   . Diabetes mellitus without complication (HCC)   . Gout    no recent flares  . Headache(784.0)    occasional headache   . Hypertension   . Lung fibrosis (HCC)     No family history on file.  Past Surgical History:  Procedure Laterality Date  . BACK SURGERY   10 yrs ago   lumbar fusion   . buinion removed Left nov 11 , 2014  . JOINT REPLACEMENT  10 yrs ago   right knee replacement   . TOTAL HIP ARTHROPLASTY Left 11/18/2012   Procedure: LEFT TOTAL HIP ARTHROPLASTY ANTERIOR APPROACH;  Surgeon: Kathryne Hitch, MD;  Location: [facility];  Service: Orthopedics;  Laterality: Left;  . TOTAL HIP ARTHROPLASTY Right 09/29/2013   Procedure: RIGHT TOTAL HIP  ARTHROPLASTY ANTERIOR APPROACH;  Surgeon: Kathryne Hitch, MD;  Location: [facility];  Service: Orthopedics;  Laterality: Right;   Social History   Occupational History  . Not on file.   Social History Main Topics  . Smoking status: Never Smoker  . Smokeless tobacco: Never Used  . Alcohol use No  . Drug use: No  . Sexual activity: Not on file

## 2018-01-20 ENCOUNTER — Ambulatory Visit (INDEPENDENT_AMBULATORY_CARE_PROVIDER_SITE_OTHER): Payer: Medicare HMO | Admitting: Physician Assistant

## 2018-02-17 ENCOUNTER — Ambulatory Visit (INDEPENDENT_AMBULATORY_CARE_PROVIDER_SITE_OTHER): Payer: Medicare HMO | Admitting: Physician Assistant

## 2018-02-17 ENCOUNTER — Ambulatory Visit (INDEPENDENT_AMBULATORY_CARE_PROVIDER_SITE_OTHER): Payer: Medicare HMO

## 2018-02-17 ENCOUNTER — Encounter (INDEPENDENT_AMBULATORY_CARE_PROVIDER_SITE_OTHER): Payer: Self-pay | Admitting: Physician Assistant

## 2018-02-17 DIAGNOSIS — M1712 Unilateral primary osteoarthritis, left knee: Secondary | ICD-10-CM

## 2018-02-17 MED ORDER — METHYLPREDNISOLONE ACETATE 40 MG/ML IJ SUSP
40.0000 mg | INTRAMUSCULAR | Status: AC | PRN
  Administered 2018-02-17: 40 mg via INTRA_ARTICULAR

## 2018-02-17 MED ORDER — LIDOCAINE HCL 1 % IJ SOLN
3.0000 mL | INTRAMUSCULAR | Status: AC | PRN
  Administered 2018-02-17: 3 mL

## 2018-02-17 NOTE — Progress Notes (Signed)
Office Visit Note   Patient: [name] L Dingus           Date of Birth: Feb 07, 1941           MRN: [id number] Visit Date: 02/17/2018              Requested by: Shelbie AmmonsHaque, Imran P, MD [address] [place], [place] [address] PCP: Shelbie AmmonsHaque, Imran P, MD   Assessment & Plan: Visit Diagnoses:  1. Primary osteoarthritis of left knee     Plan: She will monitor her glucose levels closely over the next couple of days.  She understands that she can have cortisone injections no more often than every 3 months.  She is not interested in any type of surgery.  Follow-up on an as-needed basis.  Follow-Up Instructions: Return if symptoms worsen or fail to improve.   Orders:  Orders Placed This Encounter  Procedures  . Large Joint Inj: L knee  . XR Knee 1-2 Views Left   No orders of the defined types were placed in this encounter.     Procedures: Large Joint Inj: L knee on 02/17/2018 4:05 PM Indications: pain Details: 22 G 1.5 in needle, anterolateral approach  Arthrogram: No  Medications: 3 mL lidocaine 1 %; 40 mg methylPREDNISolone acetate 40 MG/ML Outcome: tolerated well, no immediate complications Procedure, treatment alternatives, risks and benefits explained, specific risks discussed. Consent was given by the patient. Immediately prior to procedure a time out was called to verify the correct patient, procedure, equipment, support staff and site/side marked as required. Patient was prepped and draped in the usual sterile fashion.       Clinical Data: No additional findings.   Subjective: Chief Complaint  Patient presents with  . Left Knee - Pain    HPI [name] is well-known to Dr. Magnus IvanBlackman service comes in today to do to left knee pain. States the pain began this past Monday is mostly posterior aspect of the knee.  No known injury.  She last had a cortisone injection in her knee back October 2018 and it did help until recently.  She is diabetic reports that her  glucose levels have been under good control she just recently had labs but does not have the results back so far.  She is having difficulty walking due to the pain in the knee and also difficulty sleeping due to the pain.  She tried over-the-counter creams for her knee without any relief. Review of Systems Please see HPI otherwise negative  Objective: Vital Signs: There were no vitals taken for this visit.  Physical Exam  Constitutional: She is oriented to person, place, and time. She appears well-developed and well-nourished. No distress.  Pulmonary/Chest: Effort normal.  Neurological: She is alert and oriented to person, place, and time.  Skin: She is not diaphoretic.  Psychiatric: She has a normal mood and affect.    Ortho Exam Left knee no instability valgus varus stressing.  No significant effusion abnormal warmth or erythema.  She has tenderness along medial joint line.  Full extension flexion beyond 90 degrees. Specialty Comments:  No specialty comments available.  Imaging: Xr Knee 1-2 Views Left  Result Date: 02/17/2018 AP and lateral views of the left knee show bone-on-bone medial compartment severe end-stage patellofemoral arthritis and moderately severe lateral compartmental arthritis.  No acute fractures.  Knee is well located.    PMFS History: Patient Active Problem List   Diagnosis Date Noted  . Arthritis of right hip 09/29/2013  .  Status post THR (total hip replacement) 09/29/2013  . Degenerative arthritis of hip 11/18/2012   Past Medical History:  Diagnosis Date  . Arthritis   . Diabetes mellitus without complication (HCC)   . Gout    no recent flares  . Headache(784.0)    occasional headache   . Hypertension   . Lung fibrosis (HCC)     No family history on file.  Past Surgical History:  Procedure Laterality Date  . BACK SURGERY   10 yrs ago   lumbar fusion   . buinion removed Left nov 11 , 2014  . JOINT REPLACEMENT  10 yrs ago   right knee  replacement   . TOTAL HIP ARTHROPLASTY Left 11/18/2012   Procedure: LEFT TOTAL HIP ARTHROPLASTY ANTERIOR APPROACH;  Surgeon: Kathryne Hitch, MD;  Location: [facility];  Service: Orthopedics;  Laterality: Left;  . TOTAL HIP ARTHROPLASTY Right 09/29/2013   Procedure: RIGHT TOTAL HIP ARTHROPLASTY ANTERIOR APPROACH;  Surgeon: Kathryne Hitch, MD;  Location: [facility];  Service: Orthopedics;  Laterality: Right;   Social History   Occupational History  . Not on file  Tobacco Use  . Smoking status: Never Smoker  . Smokeless tobacco: Never Used  Substance and Sexual Activity  . Alcohol use: No  . Drug use: No  . Sexual activity: Not on file

## 2018-09-15 ENCOUNTER — Ambulatory Visit (INDEPENDENT_AMBULATORY_CARE_PROVIDER_SITE_OTHER): Payer: Medicare HMO | Admitting: Physician Assistant

## 2018-10-03 ENCOUNTER — Encounter (INDEPENDENT_AMBULATORY_CARE_PROVIDER_SITE_OTHER): Payer: Self-pay | Admitting: Physician Assistant

## 2018-10-03 ENCOUNTER — Ambulatory Visit (INDEPENDENT_AMBULATORY_CARE_PROVIDER_SITE_OTHER): Payer: Medicare HMO | Admitting: Physician Assistant

## 2018-10-03 DIAGNOSIS — M1712 Unilateral primary osteoarthritis, left knee: Secondary | ICD-10-CM | POA: Diagnosis not present

## 2018-10-03 MED ORDER — METHYLPREDNISOLONE ACETATE 40 MG/ML IJ SUSP
40.0000 mg | INTRAMUSCULAR | Status: AC | PRN
  Administered 2018-10-03: 40 mg via INTRA_ARTICULAR

## 2018-10-03 MED ORDER — LIDOCAINE HCL 1 % IJ SOLN
0.5000 mL | INTRAMUSCULAR | Status: AC | PRN
  Administered 2018-10-03: .5 mL

## 2018-10-03 NOTE — Progress Notes (Signed)
   Procedure Note  Patient: [name]             Date of Birth: 10-24-1940           MRN: [id number]             Visit Date: 10/03/2018  HPI: [name] well-known to Dr. Magnus Ivan service has end-stage arthritis of the left knee.  She last had a cortisone injection left knee in July 2019 and did well until recently.  She does not want to undergo any surgery for the knee right now.  She reports that her glucose levels at home are running between 130 and 160.  She is due to see her primary care physician for physical in April.  She does check her sugars at home.  She is also having bilateral hip pain.  History of bilateral hip replacements.  Having no groin pain pains on the lateral aspect of both hips without radicular symptoms down either leg.  Physical exam: Bilateral hips good range of motion without pain.  Tenderness over the trochanteric region of both hips. Left knee good range of motion.  Significant crepitus.  Tenderness along medial lateral joint line.  No abnormal warmth erythema or effusion. Procedures: Visit Diagnoses: Primary osteoarthritis of left knee  Large Joint Inj: L knee on 10/03/2018 9:50 AM Indications: pain Details: 22 G 1.5 in needle, anterolateral approach  Arthrogram: No  Medications: 40 mg methylPREDNISolone acetate 40 MG/ML; 0.5 mL lidocaine 1 % Outcome: tolerated well, no immediate complications Procedure, treatment alternatives, risks and benefits explained, specific risks discussed. Consent was given by the patient. Immediately prior to procedure a time out was called to verify the correct patient, procedure, equipment, support staff and site/side marked as required. Patient was prepped and draped in the usual sterile fashion.      Plan: Discussed with her that she needs to monitor her glucose levels closely at least checking twice daily.  She will call her primary care physician.  Glucose levels rise considerable length.  In regards to the  trochanteric bursitis both hips recommend IT band stretching exercises which were reviewed with her.  Should follow-up with [place] on as-needed basis.  Questions encouraged and answered.

## 2019-05-13 DIAGNOSIS — Z9119 Patient's noncompliance with other medical treatment and regimen: Secondary | ICD-10-CM | POA: Diagnosis not present

## 2019-05-13 DIAGNOSIS — I160 Hypertensive urgency: Secondary | ICD-10-CM | POA: Diagnosis not present

## 2019-05-13 DIAGNOSIS — R519 Headache, unspecified: Secondary | ICD-10-CM | POA: Diagnosis not present

## 2019-05-13 DIAGNOSIS — H1131 Conjunctival hemorrhage, right eye: Secondary | ICD-10-CM | POA: Diagnosis not present

## 2019-05-13 DIAGNOSIS — K219 Gastro-esophageal reflux disease without esophagitis: Secondary | ICD-10-CM

## 2019-05-13 DIAGNOSIS — E785 Hyperlipidemia, unspecified: Secondary | ICD-10-CM

## 2019-05-14 DIAGNOSIS — Z9119 Patient's noncompliance with other medical treatment and regimen: Secondary | ICD-10-CM | POA: Diagnosis not present

## 2019-05-14 DIAGNOSIS — H1131 Conjunctival hemorrhage, right eye: Secondary | ICD-10-CM | POA: Diagnosis not present

## 2019-05-14 DIAGNOSIS — R519 Headache, unspecified: Secondary | ICD-10-CM | POA: Diagnosis not present

## 2019-05-14 DIAGNOSIS — I160 Hypertensive urgency: Secondary | ICD-10-CM | POA: Diagnosis not present

## 2023-12-30 ENCOUNTER — Ambulatory Visit (INDEPENDENT_AMBULATORY_CARE_PROVIDER_SITE_OTHER): Admitting: Physician Assistant

## 2023-12-30 ENCOUNTER — Other Ambulatory Visit (INDEPENDENT_AMBULATORY_CARE_PROVIDER_SITE_OTHER): Payer: Self-pay

## 2023-12-30 DIAGNOSIS — G8929 Other chronic pain: Secondary | ICD-10-CM | POA: Diagnosis not present

## 2023-12-30 DIAGNOSIS — M25562 Pain in left knee: Secondary | ICD-10-CM

## 2023-12-30 NOTE — Progress Notes (Signed)
 HPI: [name] 83 year old female who we have not seen in several years.  Comes in today with left knee pain.  She states she has been seen elsewhere and had injections but not in the last 3 months.  Last injections it sounds as if they were viscosupplementation injections.  She notes that the injections really did not seem to last long.  She notes swelling in the knee and constant medial knee pain.  Pain is worse with walking.  She has had no known injury.  Denies any fevers chills.  Reports good control of her diabetes.  Review of systems see HPI otherwise negative  Physical exam: General Well-developed well-nourished female in no acute distress seated in wheelchair. Bilateral knees: No abnormal warmth erythema or effusion.  Right knee status post knee replacement with good range of motion.  Left knee tenderness medial joint line no instability valgus varus stressing.  Radiographs:Left knee 2 views: Shows tricompartmental arthritis with severe bone-on-bone arthritis medial compartment near bone-on-bone lateral compartment.  Patellofemoral arthritic changes are severe.  Multiple loose bodies posterior aspect knee.  No acute fractures.  Knee is well located.  Impression: End-stage tricompartmental arthritis left knee  Plan: She knows to watch her glucose levels closely over the next 24 to 48 hours.  Follow-up with us  as needed pain persist or becomes worse.  Wait 3 months between injections.

## 2024-08-02 DIAGNOSIS — I639 Cerebral infarction, unspecified: Secondary | ICD-10-CM | POA: Diagnosis not present
